# Patient Record
Sex: Male | Born: 1990 | Race: White | Hispanic: No | Marital: Single | Smoking: Current every day smoker
Health system: Southern US, Community
[De-identification: ages and names within clinical notes are randomized; demographics above are authoritative.]

## PROBLEM LIST (undated history)

## (undated) DIAGNOSIS — F32A Depression, unspecified: Secondary | ICD-10-CM

## (undated) DIAGNOSIS — F329 Major depressive disorder, single episode, unspecified: Secondary | ICD-10-CM

## (undated) DIAGNOSIS — J45909 Unspecified asthma, uncomplicated: Secondary | ICD-10-CM

## (undated) DIAGNOSIS — F1110 Opioid abuse, uncomplicated: Secondary | ICD-10-CM

---
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
## 2012-07-06 ENCOUNTER — Encounter: Payer: Self-pay | Admitting: Internal Medicine

## 2012-07-20 ENCOUNTER — Encounter: Payer: Self-pay | Admitting: Internal Medicine

## 2012-07-21 ENCOUNTER — Ambulatory Visit: Payer: Self-pay | Admitting: Internal Medicine

## 2013-01-17 ENCOUNTER — Emergency Department (HOSPITAL_COMMUNITY)
Admission: EM | Admit: 2013-01-17 | Discharge: 2013-01-18 | Disposition: A | Discharge status: Home / Self Care | Payer: BC Managed Care – PPO | Attending: Emergency Medicine | Admitting: Emergency Medicine

## 2013-01-17 ENCOUNTER — Encounter (HOSPITAL_COMMUNITY): Payer: Self-pay | Admitting: *Deleted

## 2013-01-17 DIAGNOSIS — J45909 Unspecified asthma, uncomplicated: Secondary | ICD-10-CM | POA: Insufficient documentation

## 2013-01-17 DIAGNOSIS — F329 Major depressive disorder, single episode, unspecified: Secondary | ICD-10-CM | POA: Insufficient documentation

## 2013-01-17 DIAGNOSIS — T50902A Poisoning by unspecified drugs, medicaments and biological substances, intentional self-harm, initial encounter: Secondary | ICD-10-CM

## 2013-01-17 DIAGNOSIS — T43502A Poisoning by unspecified antipsychotics and neuroleptics, intentional self-harm, initial encounter: Secondary | ICD-10-CM | POA: Insufficient documentation

## 2013-01-17 DIAGNOSIS — F3289 Other specified depressive episodes: Secondary | ICD-10-CM | POA: Insufficient documentation

## 2013-01-17 DIAGNOSIS — T438X2A Poisoning by other psychotropic drugs, intentional self-harm, initial encounter: Secondary | ICD-10-CM | POA: Insufficient documentation

## 2013-01-17 DIAGNOSIS — T424X4A Poisoning by benzodiazepines, undetermined, initial encounter: Secondary | ICD-10-CM | POA: Insufficient documentation

## 2013-01-17 DIAGNOSIS — F17200 Nicotine dependence, unspecified, uncomplicated: Secondary | ICD-10-CM | POA: Insufficient documentation

## 2013-01-17 HISTORY — DX: Depression, unspecified: F32.A

## 2013-01-17 HISTORY — DX: Major depressive disorder, single episode, unspecified: F32.9

## 2013-01-17 HISTORY — DX: Unspecified asthma, uncomplicated: J45.909

## 2013-01-17 LAB — SALICYLATE LEVEL: Salicylate Lvl: <2 mg/dL — ABNORMAL LOW (ref 2.8–20.0)

## 2013-01-17 LAB — CBC
HCT: 39.9 % (ref 39.0–52.0)
MCV: 90.9 fL (ref 78.0–100.0)
RBC: 4.39 MIL/uL (ref 4.22–5.81)

## 2013-01-17 LAB — COMPREHENSIVE METABOLIC PANEL
ALT: 10 U/L (ref 0–53)
AST: 15 U/L (ref 0–37)
Albumin: 4.2 g/dL (ref 3.5–5.2)
Alkaline Phosphatase: 61 U/L (ref 39–117)
BUN: 21 mg/dL (ref 6–23)
Calcium: 9.8 mg/dL (ref 8.4–10.5)
GFR calc Af Amer: >90 mL/min (ref >90)
GFR calc non Af Amer: >90 mL/min (ref >90)
Glucose, Bld: 78 mg/dL (ref 70–99)
Total Bilirubin: 0.5 mg/dL (ref 0.3–1.2)

## 2013-01-17 LAB — ETHANOL: Alcohol, Ethyl (B): <11 mg/dL (ref 0–11)

## 2013-01-17 MED ORDER — LORAZEPAM 1 MG PO TABS: 1.0000 mg | ORAL_TABLET | Freq: Four times a day (QID) | ORAL | Status: DC | PRN

## 2013-01-17 MED ORDER — ZIPRASIDONE MESYLATE 20 MG IM SOLR
10.0000 mg | Freq: Four times a day (QID) | INTRAMUSCULAR | Status: DC | PRN
  Administered 2013-01-17 – 2013-01-18 (×2): 10 mg via INTRAMUSCULAR
  Filled 2013-01-17 (×2): qty 20

## 2013-01-17 MED FILL — Medication: Qty: 1 | Status: AC

## 2013-01-17 NOTE — ED Notes (Signed)
Mother, Buffy: [phone]

## 2013-01-17 NOTE — ED Notes (Addendum)
Pt changed from paper scrubs into street clothes her "got from my girlfriend." Girlfriend made aware that she cannot come back to room by nurse first, but girlfriend found back in pts room refusing to leave. [employer] at bedside made girlfriend aware that she has to leave. Girlfriend escorted out. Pt remains visibly upset and stating "I shouldn't be here. No one can help me. The people who put me in here and the ones who are hurting me the most."

## 2013-01-17 NOTE — ED Notes (Signed)
Pt becoming very upset with mother and girlfriend at bedside. Pt asked both to leave, and requesting no more visitors for the evening. Nurse first made aware. Pt placed in blue scrubs. Personal belongings inventoried.

## 2013-01-17 NOTE — ED Notes (Signed)
AVA WITH THE ACT TEAM IS AWARE OF PT.

## 2013-01-17 NOTE — BH Assessment (Signed)
[facility] Assessment Progress Note      Attempted to assess patient, but he was sleeping.  Nurse tech informed Clinical research associate that his nurse had just given him a lot of medication.  Will continue to follow.

## 2013-01-17 NOTE — BHH Counselor (Signed)
Writer was contacted regarding the consult.  Writer reminded the nurse that ACT Team was at [facility] this morning and is now at [employer] patients.  Writer informed the nurse that I will be coming back to [facility] as soon as I finish the assessments at [facility].

## 2013-01-17 NOTE — ED Notes (Addendum)
Pt arrived by pov. Reports taking 20-30 klonopin  approx 30-45 mins ago. Pt drowsy, tearful on arrival.

## 2013-01-17 NOTE — ED Provider Notes (Signed)
History    CSN: [id number] Arrival date & time 01/17/13  1117  None    Chief Complaint  Patient presents with  . Ingestion  . Medical Clearance   (Consider location/radiation/quality/duration/timing/severity/associated sxs/prior Treatment) HPI  patient overdosed on Klonopin 1 mg tablets 9:30 AM today in effort to kill himself. He is depressed about "how life history me." he will not further elaborate. No treatment prior to coming. No other complaint. Patient has been noncompliant with oral Past Medical History  Diagnosis Date  . Asthma   . Depression    ADHD History reviewed. No pertinent past surgical history. History reviewed. No pertinent family history. History  Substance Use Topics  . Smoking status: Current Every Day Smoker    Types: Cigarettes  . Smokeless tobacco: Not on file  . Alcohol Use: Yes     Comment: occ    positive marijuana use Review of Systems  Psychiatric/Behavioral: Positive for dysphoric mood.  All other systems reviewed and are negative.    Allergies  Review of patient's allergies indicates no known allergies.  Home Medications  No current outpatient prescriptions on file. BP 120/80  Pulse 108  Resp 20  SpO2 98% Physical Exam  Nursing note and vitals reviewed. Constitutional: He appears well-developed and well-nourished.  HENT:  Head: Normocephalic and atraumatic.  Eyes: Conjunctivae are normal. Pupils are equal, round, and reactive to light.  Neck: Neck supple. No tracheal deviation present. No thyromegaly present.  Cardiovascular: Normal rate and regular rhythm.   No murmur heard. Pulmonary/Chest: Effort normal and breath sounds normal.  Abdominal: Soft. Bowel sounds are normal. He exhibits no distension. There is no tenderness.  Musculoskeletal: Normal range of motion. He exhibits no edema and no tenderness.  Neurological: He is alert. Coordination normal.  Skin: Skin is warm and dry. No rash noted.  Psychiatric: He has a normal  mood and affect.    ED Course  Procedures (including critical care time) Labs Reviewed  CBC  COMPREHENSIVE METABOLIC PANEL  ETHANOL  ACETAMINOPHEN LEVEL  SALICYLATE LEVEL  URINE RAPID DRUG SCREEN (HOSP PERFORMED)   No results found. No diagnosis found.   Date: 01/17/2013  Rate: 85  Rhythm: normal sinus rhythm  QRS Axis: normal  Intervals: normal  ST/T Wave abnormalities: normal  Conduction Disutrbances: none  Narrative Interpretation: unremarkable  Results for orders placed during the hospital encounter of 01/17/13  CBC      Result Value Range   WBC 9.3  4.0 - 10.5 K/uL   RBC 4.39  4.22 - 5.81 MIL/uL   Hemoglobin 14.0  13.0 - 17.0 g/dL   HCT 40.9  81.1 - 91.4 %   MCV 90.9  78.0 - 100.0 fL   MCH 31.9  26.0 - 34.0 pg   MCHC 35.1  30.0 - 36.0 g/dL   RDW 78.2  95.6 - 21.3 %   Platelets 155  150 - 400 K/uL  COMPREHENSIVE METABOLIC PANEL      Result Value Range   Sodium 143  135 - 145 mEq/L   Potassium 3.6  3.5 - 5.1 mEq/L   Chloride 105  96 - 112 mEq/L   CO2 29  19 - 32 mEq/L   Glucose, Bld 78  70 - 99 mg/dL   BUN 21  6 - 23 mg/dL   Creatinine, Ser 0.86  0.50 - 1.35 mg/dL   Calcium 9.8  8.4 - 57.8 mg/dL   Total Protein 6.6  6.0 - 8.3 g/dL   Albumin 4.2  3.5 - 5.2 g/dL   AST 15  0 - 37 U/L   ALT 10  0 - 53 U/L   Alkaline Phosphatase 61  39 - 117 U/L   Total Bilirubin 0.5  0.3 - 1.2 mg/dL   GFR calc non Af Amer >90  >90 mL/min   GFR calc Af Amer >90  >90 mL/min  ETHANOL      Result Value Range   Alcohol, Ethyl (B) <11  0 - 11 mg/dL  ACETAMINOPHEN LEVEL      Result Value Range   Acetaminophen (Tylenol), Serum <15.0  10 - 30 ug/mL  SALICYLATE LEVEL      Result Value Range   Salicylate Lvl <2.0 (*) 2.8 - 20.0 mg/dL   No results found.   Spoke with poison center plan observation 6 hours post ingestion, followed by psychiatric evaluation. 3:15 PM patient alert Glasgow Coma Score 15. No distress MDM  Involuntary commitment papers for psychiatric evaluation  followed by me as patient threatening to leave the ED Telepsychiatry  [facility]) consulted to aid in disposition and medications. ACT team consulted to arrange for patient psychiatric stay. Diagnosis #1 drug overdose #2 suicide attempt  Doug Sou, MD 01/17/13 [phone]

## 2013-01-17 NOTE — ED Notes (Signed)
[employer] at bedside. Pt has trained sitter at bedside. Remains calm and cooperative.

## 2013-01-17 NOTE — ED Provider Notes (Signed)
Patient was in noncompliant with our telemetry psychiatry evaluation.  He turned the computer off. He will require additional evaluation one is more cooperative.  We will attempt to find an inpatient psychiatry for evaluation in person.  Gerhard Munch, MD 01/17/13 1757

## 2013-01-18 NOTE — ED Provider Notes (Signed)
Pt received geodon last night to help with agitation/sleep. No current complaints.  Resting comfortably. AF VSS.  Ashby Dawes, MD 01/18/13 [phone]

## 2013-01-18 NOTE — BH Assessment (Signed)
Assessment Note  Update:  Pt received telepsych recommending pt be discharged and follow up with outpatient treatment.  Pt has a current provider, Dr. Evelene Croon, that he will follow up with.  Pt signed a No Harm contract.  Updated assessment disposition.  Updated EDP Dan Humphreys and ED staff.  Pt to be discharged.    Disposition:  Disposition Initial Assessment Completed for this Encounter: Yes Disposition of Patient: Referred to;Outpatient treatment Other disposition(s): To current provider Patient referred to: Outpatient clinic referral (Dr. Evelene Croon)  On Site Evaluation by:   Reviewed with Physician:  Fredirick Lathe, Rennis Harding 01/18/2013 1:03 PM

## 2013-01-18 NOTE — ED Provider Notes (Signed)
Behavioral health has evaluated patient and feels that he is safe for discharge home. He has followup with his psychiatrist today. I have personally assessed the patient and spoken with him regarding suicidal and homicidal ideation.  He currently denies any active thoughts of suicide or homicide. Feel patient is safe for discharge home.  Ashby Dawes, MD 01/18/13 1430

## 2013-01-18 NOTE — ED Notes (Signed)
Pt has asked that mother NOT be allowed to visit- mother was removed from POD C. Only visitor that is allowed in room is girlfriend. Mother stated on way out that "you are not keeping him safe-- that girlfriend is making him take those pills!"

## 2013-01-18 NOTE — BH Assessment (Signed)
Assessment Note   Chad James is an 22 y.o. male that presented to [facility] via his mother after taking an overdose of Klonopin yesterday.  Pt took 10 1 mg Klonopin by report because he had an argument with his girlfriend.  Pt stated they broke up, he told his mother, and she brought him here.  Pt had a telepsych scheduled for last night and turned off the computer.  Pt was also given Geodon.  Pt stated he couldn't sleep.  Pt denies current SI, stating he took the Klonopin because "I was angry and made a stupid mistake."  Pt denies HI or psychosis.  Pt admits to daily marijuana use.  Pt stated he lives alone, but is unsure how he supports himself financially, as he is not in school or working.  Pt sees a psychiatrist, Dr. Lucianne Muss.  Pt is not sure of his diagnosis, but stated he has anxiety.  Pt stated he is prescribed the Klonopin and Adderal.  Pt stated he doesn't take the medications as prescribed.  Pt stated he wanted to go home.  Consulted with EDP Walker who agreed that pt have another telepsych consult for further assessment and evaluation.  Pending telepsych.  Pt calm, cooperative, but irritable during assessment.  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 Asthma   . Depression    Axis IV: economic problems, occupational problems, other psychosocial or environmental problems, problems related to social environment and problems with primary support group Axis V: 31-40 impairment in reality testing  Past Medical History:  Past Medical History  Diagnosis Date  . Asthma   . Depression     History reviewed. No pertinent past surgical history.  Family History: History reviewed. No pertinent family history.  Social History:  reports that he has been smoking Cigarettes.  He has been smoking about 0.00 packs per day. He does not have any smokeless tobacco history on file. He reports that  drinks alcohol. He reports that he uses illicit  drugs (Marijuana).  Additional Social History:  Alcohol / Drug Use Pain Medications: see MAR Prescriptions: see MAR Over the Counter: see MAR History of alcohol / drug use?: Yes Longest period of sobriety (when/how long): unknown Negative Consequences of Use: Personal relationships Withdrawal Symptoms:  (pt denies) Substance #1 Name of Substance 1: Marijuana 1 - Age of First Use: teens 1 - Amount (size/oz): 2 bowls 1 - Frequency: daily 1 - Duration: ongoing 1 - Last Use / Amount: 01/16/13 - 2 bowls  CIWA: CIWA-Ar BP: 98/59 mmHg Pulse Rate: 61 COWS:    Allergies: No Known Allergies  Home Medications:  (Not in a hospital admission)  OB/GYN Status:  No LMP for male patient.  General Assessment Data Location of Assessment: [facility] ED Living Arrangements: Alone Can pt return to current living arrangement?: Yes Admission Status: Voluntary Is patient capable of signing voluntary admission?: Yes Transfer from: Acute Hospital Referral Source: Self/Family/Friend  Education Status Is patient currently in school?: No  Risk to self Suicidal Ideation: No-Not Currently/Within Last 6 Months Suicidal Intent: No-Not Currently/Within Last 6 Months Is patient at risk for suicide?: Yes Suicidal Plan?: Yes-Currently Present Specify Current Suicidal Plan: pt took overdose of Klonopin Access to Means: Yes Specify Access to Suicidal Means: pt is prescribed medication What has been your use of drugs/alcohol within the last 12 months?: pt admits to daily use of marijuana Previous Attempts/Gestures: No How many times?: 0  Other Self Harm Risks: pt denies Triggers for Past Attempts: None known Intentional Self Injurious Behavior: Damaging Comment - Self Injurious Behavior: ongoing SA Family Suicide History: No Recent stressful life event(s): Conflict (Comment) (breakup with girlfriend) Persecutory voices/beliefs?: No Depression: Yes Depression Symptoms: Despondent;Insomnia;Feeling  angry/irritable Substance abuse history and/or treatment for substance abuse?: No Suicide prevention information given to non-admitted patients: Not applicable  Risk to Others Homicidal Ideation: No Thoughts of Harm to Others: No Current Homicidal Intent: No Current Homicidal Plan: No Access to Homicidal Means: No Identified Victim: pt denies History of harm to others?: No Assessment of Violence: None Noted Violent Behavior Description: pt denies Does patient have access to weapons?: No Criminal Charges Pending?: No Does patient have a court date: No  Psychosis Hallucinations: None noted Delusions: None noted  Mental Status Report Appear/Hygiene: Disheveled Eye Contact: Good Motor Activity: Freedom of movement;Unremarkable Speech: Logical/coherent Level of Consciousness: Alert Mood: Irritable Affect: Irritable Anxiety Level: Moderate Thought Processes: Coherent;Relevant Judgement: Unimpaired Orientation: Person;Place;Time;Situation Obsessive Compulsive Thoughts/Behaviors: None  Cognitive Functioning Concentration: Decreased Memory: Recent Intact;Remote Intact IQ: Average Insight: Fair Impulse Control: Poor Appetite: Fair Weight Loss: 0 Weight Gain: 0 Sleep: Decreased Total Hours of Sleep: 3 Vegetative Symptoms: None  ADLScreening [facility] Assessment Services) Patient's cognitive ability adequate to safely complete daily activities?: Yes Patient able to express need for assistance with ADLs?: Yes Independently performs ADLs?: Yes (appropriate for developmental age)  Abuse/Neglect [facility]) Physical Abuse: Denies Verbal Abuse: Denies Sexual Abuse: Denies  Prior Inpatient Therapy Prior Inpatient Therapy: No Prior Therapy Dates: na Prior Therapy Facilty/Provider(s): na Reason for Treatment: na  Prior Outpatient Therapy Prior Outpatient Therapy: Yes Prior Therapy Dates: Current Prior Therapy Facilty/Provider(s): Dr. Lucianne Muss Reason for Treatment: Anxiety  ADL  Screening (condition at time of admission) Patient's cognitive ability adequate to safely complete daily activities?: Yes Is the patient deaf or have difficulty hearing?: No Does the patient have difficulty seeing, even when wearing glasses/contacts?: No Does the patient have difficulty concentrating, remembering, or making decisions?: No Patient able to express need for assistance with ADLs?: Yes Does the patient have difficulty dressing or bathing?: No Independently performs ADLs?: Yes (appropriate for developmental age) Does the patient have difficulty walking or climbing stairs?: No  Home Assistive Devices/Equipment Home Assistive Devices/Equipment: None    Abuse/Neglect Assessment (Assessment to be complete while patient is alone) Physical Abuse: Denies Verbal Abuse: Denies Sexual Abuse: Denies Exploitation of patient/patient's resources: Denies Self-Neglect: Denies Values / Beliefs Cultural Requests During Hospitalization: None Spiritual Requests During Hospitalization: None Consults Spiritual Care Consult Needed: No Social Work Consult Needed: No Merchant navy officer (For Healthcare) Advance Directive: Patient does not have advance directive;Patient would not like information    Additional Information 1:1 In Past 12 Months?: No CIRT Risk: No Elopement Risk: No Does patient have medical clearance?: Yes     Disposition:  Disposition Initial Assessment Completed for this Encounter: Yes Disposition of Patient: Other dispositions Other disposition(s): Other (Comment) (Pending telepsych)  On Site Evaluation by:   Reviewed with Physician:  Fredirick Lathe, Rennis Harding 01/18/2013 10:06 AM

## 2013-01-18 NOTE — ED Notes (Signed)
Spoke with tele psych--

## 2013-01-18 NOTE — ED Notes (Signed)
Pt states that he would like to go home, denies trying to hurt self=states was fighting with girlfriend. States has a hx of depression/anxiety--with an eating disorder. Denies hearing voices--denies visual hallucinations

## 2013-01-18 NOTE — ED Notes (Signed)
Pt signed contract for safety.  

## 2013-04-13 DIAGNOSIS — F329 Major depressive disorder, single episode, unspecified: Secondary | ICD-10-CM | POA: Insufficient documentation

## 2014-05-26 ENCOUNTER — Encounter (HOSPITAL_COMMUNITY): Payer: Self-pay | Admitting: *Deleted

## 2014-05-26 ENCOUNTER — Emergency Department (HOSPITAL_COMMUNITY)
Admission: EM | Admit: 2014-05-26 | Discharge: 2014-05-26 | Disposition: A | Discharge status: Home / Self Care | Payer: BC Managed Care – PPO | Attending: Emergency Medicine | Admitting: Emergency Medicine

## 2014-05-26 DIAGNOSIS — Z79899 Other long term (current) drug therapy: Secondary | ICD-10-CM | POA: Insufficient documentation

## 2014-05-26 DIAGNOSIS — Y9389 Activity, other specified: Secondary | ICD-10-CM | POA: Insufficient documentation

## 2014-05-26 DIAGNOSIS — Z720 Tobacco use: Secondary | ICD-10-CM | POA: Insufficient documentation

## 2014-05-26 DIAGNOSIS — F329 Major depressive disorder, single episode, unspecified: Secondary | ICD-10-CM | POA: Insufficient documentation

## 2014-05-26 DIAGNOSIS — S40851A Superficial foreign body of right upper arm, initial encounter: Secondary | ICD-10-CM | POA: Insufficient documentation

## 2014-05-26 DIAGNOSIS — J45909 Unspecified asthma, uncomplicated: Secondary | ICD-10-CM | POA: Insufficient documentation

## 2014-05-26 DIAGNOSIS — W260XXA Contact with knife, initial encounter: Secondary | ICD-10-CM | POA: Insufficient documentation

## 2014-05-26 DIAGNOSIS — Y998 Other external cause status: Secondary | ICD-10-CM | POA: Insufficient documentation

## 2014-05-26 DIAGNOSIS — Y9289 Other specified places as the place of occurrence of the external cause: Secondary | ICD-10-CM | POA: Insufficient documentation

## 2014-05-26 HISTORY — DX: Opioid abuse, uncomplicated: F11.10

## 2014-05-26 MED ORDER — LIDOCAINE-EPINEPHRINE (PF) 2 %-1:200000 IJ SOLN
10.0000 mL | Freq: Once | INTRAMUSCULAR | Status: AC
  Administered 2014-05-26: 10 mL

## 2014-05-26 MED ORDER — SULFAMETHOXAZOLE-TRIMETHOPRIM 800-160 MG PO TABS: 1.0000 | ORAL_TABLET | Freq: Two times a day (BID) | ORAL | Status: DC

## 2014-05-26 MED ORDER — LIDOCAINE-EPINEPHRINE (PF) 2 %-1:200000 IJ SOLN
INTRAMUSCULAR | Status: AC
  Filled 2014-05-26: qty 20

## 2014-05-26 MED ORDER — NAPROXEN 500 MG PO TABS: 500.0000 mg | ORAL_TABLET | Freq: Two times a day (BID) | ORAL | Status: DC

## 2014-05-26 NOTE — ED Provider Notes (Signed)
CSN: [id number]     Arrival date & time 05/26/14  [id number] History  This chart was scribed for Vida RollerBrian D Jamail Cullers, MD by Roxy Cedarhandni Bhalodia, ED Scribe. This patient was seen in room A02C/A02C and the patient's care was started at 3:30 AM.   Chief Complaint  Patient presents with  . Abscess   Patient is a 23 y.o. male presenting with abscess. The history is provided by the patient. No language interpreter was used.  Abscess Associated symptoms: fever    HPI Comments: Chad James is a 23 y.o. male who presents to the Emergency Department complaining of abscess to right antecubital area due to a metal needle that broke off while patient was shooting heroin 1.5 weeks ago. He reports associated fevers and chills due to withdrawal.  Past Medical History  Diagnosis Date  . Asthma   . Depression   . Heroin abuse    History reviewed. No pertinent past surgical history. No family history on file. History  Substance Use Topics  . Smoking status: Current Every Day Smoker    Types: Cigarettes  . Smokeless tobacco: Not on file  . Alcohol Use: Yes     Comment: occ   Review of Systems  Constitutional: Positive for fever and chills.  Skin: Positive for wound.  All other systems reviewed and are negative.  Allergies  Review of patient's allergies indicates no known allergies.  Home Medications   Prior to Admission medications   Medication Sig Start Date End Date Taking? Authorizing Provider  albuterol (PROVENTIL HFA;VENTOLIN HFA) 108 (90 BASE) MCG/ACT inhaler Inhale 2 puffs into the lungs every 6 (six) hours as needed for wheezing.   Yes Historical Provider, MD  clonazePAM (KLONOPIN) 1 MG tablet Take 1 mg by mouth daily as needed for anxiety.   Yes Historical Provider, MD  diphenhydrAMINE (BENADRYL) 25 MG tablet Take 25 mg by mouth every 6 (six) hours as needed for allergies.   Yes Historical Provider, MD  Melatonin 5 MG TABS Take 5 mg by mouth at bedtime as needed (sleep).   Yes Historical  Provider, MD  megestrol (MEGACE) 40 MG/ML suspension Take 400 mg by mouth daily.    Historical Provider, MD  naproxen (NAPROSYN) 500 MG tablet Take 1 tablet (500 mg total) by mouth 2 (two) times daily with a meal. 05/26/14   Vida RollerBrian D Kenyetta Fife, MD  sulfamethoxazole-trimethoprim (SEPTRA DS) 800-160 MG per tablet Take 1 tablet by mouth every 12 (twelve) hours. 05/26/14   Vida RollerBrian D Abdimalik Mayorquin, MD   Triage Vitals: BP 116/65 mmHg  Pulse 89  Temp(Src) 98 F (36.7 C) (Oral)  Resp 18  SpO2 97%  Physical Exam  Constitutional: He appears well-developed and well-nourished.  HENT:  Head: Normocephalic and atraumatic.  Eyes: Conjunctivae are normal. Right eye exhibits no discharge. Left eye exhibits no discharge.  Pulmonary/Chest: Effort normal. No respiratory distress.  Neurological: He is alert. Coordination normal.  Skin: Skin is warm and dry. No rash noted. He is not diaphoretic. There is erythema.  R. AC scarring along vein line. Mild tenderness and redness. US images archived confirming metallic foreign body.  Psychiatric: He has a normal mood and affect.  Nursing note and vitals reviewed.  ED Course  Procedures (including critical care time)  DIAGNOSTIC STUDIES: Oxygen Saturation is 97% on RA, normal by my interpretation.    COORDINATION OF CARE: 3:34 AM- Discussed plans to apply lidocaine injection to affected area and will attempt to surgically excise metal needle from patient's arm. Pt  advised of plan for treatment and pt agrees.  Labs Review Labs Reviewed - No data to display  Imaging Review No results found.   MDM   Final diagnoses:  Foreign body of upper arm, right, initial encounter    Bedside ultrasound reveals a foreign body in the tissues in the left antecubital fossa. Please see procedure note below  Procedure Note:  Ultrasound soft tissue performed by Vida RollerBrian D Murlin Schrieber, MD  Upper extremity Other (refer to comments) No abcess noted and Other metallic FB seen - linear No  abcess noted  Images archived.  Procedure note:  Foreign body removal from soft tissue  Risks benefits and alternatives of the procedure were discussed with the patient, verbal and written consent obtained area Patient placed in the supine position, right antecubital fossa was exposed, sterilized with Betadine, allowed to dry completely. This area remained sterile throughout the procedure. Sterile drapes applied, lidocaine 1% with epinephrine injected into the surrounding skin Incision made with #11 blade oversight of foreign body, exploration with forceps until foreign body was discovered and removed with forceps.  Sutures placed loosely to allow drainage from the wound, hemostasis obtained, sterile dressing placed  Patient instructed on how to care for the wound, tolerated procedure without difficulty.   Meds given in ED:  Medications  lidocaine-EPINEPHrine (XYLOCAINE W/EPI) 2 %-1:200000 (PF) injection (  Duplicate 05/26/14 0450)  lidocaine-EPINEPHrine (XYLOCAINE W/EPI) 2 %-1:200000 (PF) injection 10 mL (10 mLs Infiltration Given 05/26/14 0449)    New Prescriptions   NAPROXEN (NAPROSYN) 500 MG TABLET    Take 1 tablet (500 mg total) by mouth 2 (two) times daily with a meal.   SULFAMETHOXAZOLE-TRIMETHOPRIM (SEPTRA DS) 800-160 MG PER TABLET    Take 1 tablet by mouth every 12 (twelve) hours.        I personally performed the services described in this documentation, which was scribed in my presence. The recorded information has been reviewed and is accurate.   Vida RollerBrian D Angeldejesus Callaham, MD 05/26/14 [phone]

## 2014-05-26 NOTE — ED Notes (Signed)
Pt is concerned about that he has an [facility]  He was trying to shoot up heroin.

## 2014-05-26 NOTE — Discharge Instructions (Signed)
Please be aware that because you had a needle in your arm you are predisposed to infections such as staph infections. I am treating it with an antibiotic to hopefully prevent that from happening. Please change or dressing twice a day, take the antibiotics for 10 days and follow-up in 2 days for a wound check with your family doctor or he may return to the hospital for a wound check to make sure that it is not becoming infected. The stitches should come out in 7 days by a healthcare professional.  Please call your doctor for a followup appointment within 24-48 hours. When you talk to your doctor please let them know that you were seen in the emergency department and have them acquire all of your records so that they can discuss the findings with you and formulate a treatment plan to fully care for your new and ongoing problems.

## 2014-05-31 ENCOUNTER — Emergency Department (HOSPITAL_COMMUNITY): Payer: Self-pay

## 2014-05-31 ENCOUNTER — Encounter (HOSPITAL_COMMUNITY): Payer: Self-pay | Admitting: Emergency Medicine

## 2014-05-31 ENCOUNTER — Emergency Department (HOSPITAL_COMMUNITY)
Admission: EM | Admit: 2014-05-31 | Discharge: 2014-05-31 | Disposition: A | Discharge status: Home / Self Care | Payer: Self-pay | Attending: Emergency Medicine | Admitting: Emergency Medicine

## 2014-05-31 DIAGNOSIS — J45909 Unspecified asthma, uncomplicated: Secondary | ICD-10-CM | POA: Insufficient documentation

## 2014-05-31 DIAGNOSIS — Z8659 Personal history of other mental and behavioral disorders: Secondary | ICD-10-CM | POA: Insufficient documentation

## 2014-05-31 DIAGNOSIS — L03113 Cellulitis of right upper limb: Secondary | ICD-10-CM | POA: Insufficient documentation

## 2014-05-31 DIAGNOSIS — Z720 Tobacco use: Secondary | ICD-10-CM | POA: Insufficient documentation

## 2014-05-31 DIAGNOSIS — Z791 Long term (current) use of non-steroidal anti-inflammatories (NSAID): Secondary | ICD-10-CM | POA: Insufficient documentation

## 2014-05-31 DIAGNOSIS — R0602 Shortness of breath: Secondary | ICD-10-CM | POA: Insufficient documentation

## 2014-05-31 LAB — CBC WITH DIFFERENTIAL/PLATELET
Basophils Absolute: 0 10*3/uL (ref 0.0–0.1)
Basophils Relative: 0 % (ref 0–1)
EOS ABS: 0.1 10*3/uL (ref 0.0–0.7)
EOS PCT: 1 % (ref 0–5)
HCT: 46.1 % (ref 39.0–52.0)
HEMOGLOBIN: 15.7 g/dL (ref 13.0–17.0)
LYMPHS ABS: 1.4 10*3/uL (ref 0.7–4.0)
LYMPHS PCT: 14 % (ref 12–46)
MCH: 31.1 pg (ref 26.0–34.0)
MCHC: 34.1 g/dL (ref 30.0–36.0)
MCV: 91.3 fL (ref 78.0–100.0)
MONOS PCT: 4 % (ref 3–12)
Monocytes Absolute: 0.4 10*3/uL (ref 0.1–1.0)
Neutro Abs: 8.1 10*3/uL — ABNORMAL HIGH (ref 1.7–7.7)
Neutrophils Relative %: 81 % — ABNORMAL HIGH (ref 43–77)
Platelets: 216 10*3/uL (ref 150–400)
RBC: 5.05 MIL/uL (ref 4.22–5.81)
RDW: 12.4 % (ref 11.5–15.5)
WBC: 10.1 10*3/uL (ref 4.0–10.5)

## 2014-05-31 LAB — COMPREHENSIVE METABOLIC PANEL
ALK PHOS: 90 U/L (ref 39–117)
ALT: 13 U/L (ref 0–53)
ANION GAP: 14 (ref 5–15)
AST: 22 U/L (ref 0–37)
Albumin: 4.3 g/dL (ref 3.5–5.2)
BUN: 12 mg/dL (ref 6–23)
CO2: 26 meq/L (ref 19–32)
Calcium: 9.5 mg/dL (ref 8.4–10.5)
Chloride: 100 mEq/L (ref 96–112)
Creatinine, Ser: 1.12 mg/dL (ref 0.50–1.35)
GLUCOSE: 78 mg/dL (ref 70–99)
POTASSIUM: 4.6 meq/L (ref 3.7–5.3)
Sodium: 140 mEq/L (ref 137–147)
TOTAL PROTEIN: 6.9 g/dL (ref 6.0–8.3)
Total Bilirubin: <0.2 mg/dL — ABNORMAL LOW (ref 0.3–1.2)

## 2014-05-31 LAB — I-STAT TROPONIN, ED: Troponin i, poc: 0 ng/mL (ref 0.00–0.08)

## 2014-05-31 MED ORDER — HYDROCODONE-ACETAMINOPHEN 5-325 MG PO TABS
1.0000 | ORAL_TABLET | Freq: Once | ORAL | Status: AC
  Administered 2014-05-31: 1 via ORAL

## 2014-05-31 MED ORDER — SODIUM CHLORIDE 0.9 % IV BOLUS (SEPSIS)
1000.0000 mL | Freq: Once | INTRAVENOUS | Status: AC
  Administered 2014-05-31: 1000 mL via INTRAVENOUS

## 2014-05-31 NOTE — ED Notes (Signed)
Attempted IV x2. Janett BillowJennaya, RN at the bedside attempting.

## 2014-05-31 NOTE — ED Notes (Signed)
The pt denies drug use yhet he is falling asleep

## 2014-05-31 NOTE — ED Notes (Signed)
The  Person with the pt came out of triage to say he was acting weird acting like he was going to pass out.  Vitals rechecked.  He is acting differnet

## 2014-05-31 NOTE — ED Notes (Signed)
The pt  Is having an allergic reaction to soemthing.  Generalized rash swelling in his  Hands and feet for one hour.  He feels like he cannot breathe as well as usual.  No previous history

## 2014-05-31 NOTE — ED Provider Notes (Signed)
CSN: [id number]     Arrival date & time 05/31/14  1959 History   First MD Initiated Contact with Patient 05/31/14 2149     Chief Complaint  Patient presents with  . Allergic Reaction     (Consider location/radiation/quality/duration/timing/severity/associated sxs/prior Treatment) HPI  23 year old male with a known allergies this emergency department today for questionable allergic reaction. Patient states that he had full body rash along with shortness of breath and the feeling that his throat was closing and lips were swelling approximately 3 hours prior to arrival. Patient did not have any interventions and came here for further evaluation. Patient found to be sleepy during exam as reported recent marijuana use but denies this to me. He also states that he had a foreign body in his right AC fossa that was removed approximately a week ago and that has been hurting worse as well. His symptoms are now improving where he does not have as bad of a rash feels like his lips are still slightly swollen but not as bad before and he does not have the feeling of his throat closing up any longer.  Past Medical History  Diagnosis Date  . Asthma   . Depression   . Heroin abuse    History reviewed. No pertinent past surgical history. No family history on file. History  Substance Use Topics  . Smoking status: Current Every Day Smoker    Types: Cigarettes  . Smokeless tobacco: Not on file  . Alcohol Use: Yes     Comment: occ    Review of Systems  Constitutional: Negative for fever, chills and fatigue.  Respiratory: Positive for shortness of breath.   Genitourinary: Negative for hematuria and enuresis.  Skin: Positive for rash.  All other systems reviewed and are negative.     Allergies  Review of patient's allergies indicates no known allergies.  Home Medications   Prior to Admission medications   Medication Sig Start Date End Date Taking? Authorizing Provider  clonazePAM (KLONOPIN) 1  MG tablet Take 1 mg by mouth daily as needed for anxiety.   Yes Historical Provider, MD  naproxen (NAPROSYN) 500 MG tablet Take 1 tablet (500 mg total) by mouth 2 (two) times daily with a meal. 05/26/14  Yes Vida RollerBrian D Miller, MD  Pseudoephedrine-APAP-DM (DAYQUIL PO) Take 1 Dose by mouth as needed (cold symptoms).   Yes Historical Provider, MD  sulfamethoxazole-trimethoprim (SEPTRA DS) 800-160 MG per tablet Take 1 tablet by mouth every 12 (twelve) hours. 05/26/14  Yes Vida RollerBrian D Miller, MD   BP 108/67 mmHg  Pulse 88  Temp(Src) 98.5 F (36.9 C) (Oral)  Resp 18  Ht 5\' 3"  (1.6 m)  Wt 155 lb (70.308 kg)  BMI 27.46 kg/m2  SpO2 95% Physical Exam  Constitutional: He is oriented to person, place, and time. He appears well-developed and well-nourished.  HENT:  Head: Normocephalic and atraumatic.  Eyes: Pupils are equal, round, and reactive to light.  Cardiovascular: Normal rate and regular rhythm.   Pulmonary/Chest: Effort normal and breath sounds normal. He has no wheezes. He has no rales.  Abdominal: Soft. He exhibits no distension. There is no tenderness.  Neurological: He is alert and oriented to person, place, and time.  Skin: Skin is warm and dry. Rash ( diffuse papular rash) noted.  Nursing note and vitals reviewed.   ED Course  Procedures (including critical care time) Labs Review Labs Reviewed  CBC WITH DIFFERENTIAL - Abnormal; Notable for the following:    Neutrophils Relative %  81 (*)    Neutro Abs 8.1 (*)    All other components within normal limits  COMPREHENSIVE METABOLIC PANEL - Abnormal; Notable for the following:    Total Bilirubin <0.2 (*)    All other components within normal limits  URINE RAPID DRUG SCREEN (HOSP PERFORMED)  I-STAT TROPOININ, ED    Imaging Review Dg Chest 2 View  05/31/2014   CLINICAL DATA:  Shortness of breath, asthma  EXAM: CHEST  2 VIEW  COMPARISON:  02/28/2004  FINDINGS: Lungs are essentially clear. No focal consolidation. No pleural effusion or  pneumothorax.  The heart is normal in size.  Visualized osseous structures are within normal limits.  IMPRESSION: No evidence of acute cardiopulmonary disease.   Electronically Signed   By: Charline BillsSriyesh  Krishnan M.D.   On: 05/31/2014 23:43     EKG Interpretation None      MDM   Final diagnoses:  SOB (shortness of breath)  Cellulitis of right upper extremity    23 year old male with likely allergic reaction resolved without intervention. Stable on my evaluation. Labs and imaging negative patient alert and oriented the whole time during evaluation. Still on antibiotics for the cellulitis in his right arm which has improved per the ED physician who took care of him initially. To be stable for discharge to continue antibiotics and to return here in 3-4 days for suture removal.   Marily MemosJason Tomeeka Plaugher, MD 06/01/14 0104  Richardean Canalavid H Yao, MD 06/01/14 [id number]

## 2014-05-31 NOTE — ED Notes (Addendum)
Patient back from XR and refusing to have an EKG completed. Explained to patient how our evaluation process works.  Additionally patient is refusing to give us a urine sample. Explained to patient again our evaluation process.

## 2014-05-31 NOTE — ED Notes (Signed)
Pt attempted to leave before receiving d/c papers and signing, pt advised he needed to wait until papers were brought in. Pt back to room 35 at this time.

## 2014-05-31 NOTE — ED Notes (Signed)
EKG delayed due to patient in XR

## 2014-05-31 NOTE — ED Notes (Signed)
The pt admits to smoking marijuana earlier today

## 2014-05-31 NOTE — Discharge Instructions (Signed)
Continue taking your antibiotics.   Stop using drugs.   Suture removal in several days.   Follow up with your doctor.   Return to ER if you have severe pain, fever, rash, trouble breathing.

## 2014-05-31 NOTE — ED Notes (Signed)
The pt is getting sleepier and sleepier

## 2014-07-09 ENCOUNTER — Emergency Department (HOSPITAL_COMMUNITY)
Admission: EM | Admit: 2014-07-09 | Discharge: 2014-07-09 | Disposition: A | Discharge status: Home / Self Care | Payer: Self-pay | Attending: Emergency Medicine | Admitting: Emergency Medicine

## 2014-07-09 ENCOUNTER — Encounter (HOSPITAL_COMMUNITY): Payer: Self-pay | Admitting: *Deleted

## 2014-07-09 DIAGNOSIS — R1110 Vomiting, unspecified: Secondary | ICD-10-CM | POA: Insufficient documentation

## 2014-07-09 DIAGNOSIS — L02416 Cutaneous abscess of left lower limb: Secondary | ICD-10-CM | POA: Insufficient documentation

## 2014-07-09 DIAGNOSIS — Z8659 Personal history of other mental and behavioral disorders: Secondary | ICD-10-CM | POA: Insufficient documentation

## 2014-07-09 DIAGNOSIS — Z792 Long term (current) use of antibiotics: Secondary | ICD-10-CM | POA: Insufficient documentation

## 2014-07-09 DIAGNOSIS — R000 Tachycardia, unspecified: Secondary | ICD-10-CM | POA: Insufficient documentation

## 2014-07-09 DIAGNOSIS — L0291 Cutaneous abscess, unspecified: Secondary | ICD-10-CM

## 2014-07-09 DIAGNOSIS — J45909 Unspecified asthma, uncomplicated: Secondary | ICD-10-CM | POA: Insufficient documentation

## 2014-07-09 DIAGNOSIS — Z791 Long term (current) use of non-steroidal anti-inflammatories (NSAID): Secondary | ICD-10-CM | POA: Insufficient documentation

## 2014-07-09 DIAGNOSIS — Z720 Tobacco use: Secondary | ICD-10-CM | POA: Insufficient documentation

## 2014-07-09 DIAGNOSIS — R609 Edema, unspecified: Secondary | ICD-10-CM

## 2014-07-09 LAB — CBC WITH DIFFERENTIAL/PLATELET
BASOS ABS: 0 10*3/uL (ref 0.0–0.1)
BASOS PCT: 0 % (ref 0–1)
EOS PCT: 0 % (ref 0–5)
Eosinophils Absolute: 0 10*3/uL (ref 0.0–0.7)
HEMATOCRIT: 44.9 % (ref 39.0–52.0)
HEMOGLOBIN: 15.6 g/dL (ref 13.0–17.0)
LYMPHS PCT: 12 % (ref 12–46)
Lymphs Abs: 1.3 10*3/uL (ref 0.7–4.0)
MCH: 31.2 pg (ref 26.0–34.0)
MCHC: 34.7 g/dL (ref 30.0–36.0)
MCV: 89.8 fL (ref 78.0–100.0)
MONO ABS: 0.9 10*3/uL (ref 0.1–1.0)
MONOS PCT: 8 % (ref 3–12)
Neutro Abs: 9.2 10*3/uL — ABNORMAL HIGH (ref 1.7–7.7)
Neutrophils Relative %: 80 % — ABNORMAL HIGH (ref 43–77)
Platelets: 167 10*3/uL (ref 150–400)
RBC: 5 MIL/uL (ref 4.22–5.81)
RDW: 12.5 % (ref 11.5–15.5)
WBC: 11.5 10*3/uL — ABNORMAL HIGH (ref 4.0–10.5)

## 2014-07-09 LAB — COMPREHENSIVE METABOLIC PANEL
ALBUMIN: 4.6 g/dL (ref 3.5–5.2)
ALT: 17 U/L (ref 0–53)
AST: 20 U/L (ref 0–37)
Alkaline Phosphatase: 85 U/L (ref 39–117)
Anion gap: 7 (ref 5–15)
BUN: 15 mg/dL (ref 6–23)
CALCIUM: 9.8 mg/dL (ref 8.4–10.5)
CHLORIDE: 99 meq/L (ref 96–112)
CO2: 30 mmol/L (ref 19–32)
CREATININE: 1.08 mg/dL (ref 0.50–1.35)
GFR calc Af Amer: >90 mL/min (ref >90)
Glucose, Bld: 114 mg/dL — ABNORMAL HIGH (ref 70–99)
Potassium: 4.4 mmol/L (ref 3.5–5.1)
SODIUM: 136 mmol/L (ref 135–145)
Total Bilirubin: 1.1 mg/dL (ref 0.3–1.2)
Total Protein: 7.3 g/dL (ref 6.0–8.3)

## 2014-07-09 MED ORDER — LIDOCAINE-EPINEPHRINE 2 %-1:100000 IJ SOLN
20.0000 mL | Freq: Once | INTRAMUSCULAR | Status: DC
  Filled 2014-07-09: qty 20

## 2014-07-09 MED ORDER — IBUPROFEN 800 MG PO TABS: 800.0000 mg | ORAL_TABLET | Freq: Three times a day (TID) | ORAL | Status: DC

## 2014-07-09 MED ORDER — HYDROCODONE-ACETAMINOPHEN 5-325 MG PO TABS: 2.0000 | ORAL_TABLET | Freq: Once | ORAL | Status: DC

## 2014-07-09 MED ORDER — SODIUM CHLORIDE 0.9 % IV BOLUS (SEPSIS)
1000.0000 mL | Freq: Once | INTRAVENOUS | Status: AC
  Administered 2014-07-09: 1000 mL via INTRAVENOUS

## 2014-07-09 MED ORDER — LIDOCAINE HCL (PF) 2 % IJ SOLN
10.0000 mL | Freq: Once | INTRAMUSCULAR | Status: AC
  Administered 2014-07-09: 10 mL
  Filled 2014-07-09: qty 10

## 2014-07-09 MED ORDER — OXYCODONE-ACETAMINOPHEN 5-325 MG PO TABS
1.0000 | ORAL_TABLET | Freq: Once | ORAL | Status: AC
  Administered 2014-07-09: 1 via ORAL
  Filled 2014-07-09: qty 1

## 2014-07-09 MED ORDER — CEPHALEXIN 500 MG PO CAPS: 500.0000 mg | ORAL_CAPSULE | Freq: Four times a day (QID) | ORAL | Status: DC

## 2014-07-09 NOTE — ED Notes (Signed)
Patient states he noticed a red spot on day one.  2nd day he noted more pain and more redness with pain.  Patient now has large swollen are from mid forearm to fingers on the left arm.  The center or spot has shinny red area with scab.  Patient did try to pop it yestereday.  Patient denies any known animal bite.  No recent laceration.  Patient states this is the worse pain.  No pain meds today

## 2014-07-09 NOTE — ED Provider Notes (Signed)
CSN: [id number]     Arrival date & time 07/09/14  1432 History   First MD Initiated Contact with Patient 07/09/14 1559     This chart was scribed for non-physician practitioner, Joycie Peek PA-C working with Toy Cookey, MD by Arlan Organ, ED Scribe. This patient was seen in room TR09C/TR09C and the patient's care was started at 4:22 PM.   Chief Complaint  Patient presents with  . Insect Bite  . Arm Swelling   The history is provided by the patient and a parent. No language interpreter was used.    HPI Comments: Chad James is a 24 y.o. Male with a PMHx of Asthma who presents to the Emergency Department complaining of a possible insect bite to the L forearm x 3 days. Pt reports swelling and redness to the medial aspect of forearm. Area is painful and progressively worsening  with intermittent numbness and paresthesia to the L arm. Pain is exacerbated with movements of arm and palpation. He has tried warm compresses to the arm. No OTC medication taken for relief. States he attempted to "pop" the area with a needle without any success. [name] also mentions 1 episode of vomiting 2 days ago. No recent fever, chills, nausea, abdominal pain, diarrhea, constipations. Pt is an every day smoker. No known allergies to medications.  Past Medical History  Diagnosis Date  . Asthma   . Depression   . Heroin abuse    History reviewed. No pertinent past surgical history. No family history on file. History  Substance Use Topics  . Smoking status: Current Every Day Smoker    Types: Cigarettes  . Smokeless tobacco: Not on file  . Alcohol Use: Yes     Comment: occ    Review of Systems  Constitutional: Negative for fever and chills.  Gastrointestinal: Positive for vomiting. Negative for nausea, abdominal pain, diarrhea and constipation.  Skin: Positive for wound.  Neurological: Positive for numbness.  All other systems reviewed and are negative.     Allergies  Review of patient's  allergies indicates no known allergies.  Home Medications   Prior to Admission medications   Medication Sig Start Date End Date Taking? Authorizing Provider  cephALEXin (KEFLEX) 500 MG capsule Take 1 capsule (500 mg total) by mouth 4 (four) times daily. 07/09/14   Earle Gell Coner Gibbard, PA-C  clonazePAM (KLONOPIN) 1 MG tablet Take 1 mg by mouth daily as needed for anxiety.    Historical Provider, MD  ibuprofen (ADVIL,MOTRIN) 800 MG tablet Take 1 tablet (800 mg total) by mouth 3 (three) times daily. 07/09/14   Earle Gell Islah Eve, PA-C  naproxen (NAPROSYN) 500 MG tablet Take 1 tablet (500 mg total) by mouth 2 (two) times daily with a meal. 05/26/14   Vida Roller, MD  Pseudoephedrine-APAP-DM (DAYQUIL PO) Take 1 Dose by mouth as needed (cold symptoms).    Historical Provider, MD  sulfamethoxazole-trimethoprim (SEPTRA DS) 800-160 MG per tablet Take 1 tablet by mouth every 12 (twelve) hours. 05/26/14   Vida Roller, MD    Triage Vitals: BP 123/80 mmHg  Pulse 117  Temp(Src) 98.8 F (37.1 C) (Oral)  Resp 20  Ht  (1.905 m)  Wt 130 lb 5 oz (59.109 kg)  BMI 16.29 kg/m2  SpO2 98%   Physical Exam  Constitutional: He appears well-developed and well-nourished. No distress.  HENT:  Head: Normocephalic and atraumatic.  Neck: Neck supple.  Cardiovascular: Regular rhythm, S1 normal and normal heart sounds.  Tachycardia present.  Pulmonary/Chest: Effort normal and breath sounds normal.  Abdominal: Soft. Bowel sounds are normal.  Musculoskeletal:  Maintains full active ROM of left upper extremity and wrist  Neurological: He is alert.  Grip strength intact. Motor and sensation 5/5.  Skin: He is not diaphoretic.  Area of abscess over medial aspect of L wrist approximately 3 cm in diameter No active drainage There is significant erythema and edma around site with streaking to the mid shaft of the L forearm  Nursing note and vitals reviewed.     ED Course  Procedures (including critical  care time)  DIAGNOSTIC STUDIES: Oxygen Saturation is 98% on RA, Normal by my interpretation.    COORDINATION OF CARE: 4:33 PM- Will perform I&D procedure. Discussed treatment plan with pt at bedside and pt agreed to plan.     INCISION AND DRAINAGE PROCEDURE NOTE: Patient identification was confirmed and verbal consent was obtained. This procedure was performed by Joycie Peek PA-C at 5:49 PM. Site: L forearm Sterile procedures observed Anesthetic used (type and amt): 5 CC of 2% Lidocaine without epinephrine  Drainage: Moderate Complexity: Complex Packing used: None Site anesthetized, incision made over site, wound drained and explored loculations, rinsed with copious amounts of normal saline, wound packed with sterile gauze, covered with dry, sterile dressing.  Pt tolerated procedure well without complications.  Instructions for care discussed verbally and pt provided with additional written instructions for homecare and f/u.  Labs Review Labs Reviewed  CBC WITH DIFFERENTIAL - Abnormal; Notable for the following:    WBC 11.5 (*)    Neutrophils Relative % 80 (*)    Neutro Abs 9.2 (*)    All other components within normal limits  COMPREHENSIVE METABOLIC PANEL - Abnormal; Notable for the following:    Glucose, Bld 114 (*)    All other components within normal limits  WOUND CULTURE    Imaging Review No results found.   EKG Interpretation None     Meds given in ED:  Medications  oxyCODONE-acetaminophen (PERCOCET/ROXICET) 5-325 MG per tablet 1 tablet (1 tablet Oral Given 07/09/14 1507)  sodium chloride 0.9 % bolus 1,000 mL (0 mLs Intravenous Stopped 07/09/14 1735)  lidocaine (XYLOCAINE) 2 % injection 10 mL (10 mLs Other Given 07/09/14 1723)    New Prescriptions   CEPHALEXIN (KEFLEX) 500 MG CAPSULE    Take 1 capsule (500 mg total) by mouth 4 (four) times daily.   IBUPROFEN (ADVIL,MOTRIN) 800 MG TABLET    Take 1 tablet (800 mg total) by mouth 3 (three) times daily.    Filed Vitals:   07/09/14 1446 07/09/14 1448 07/09/14 1654  BP: 123/80  101/65  Pulse: 117  92  Temp: 98.8 F (37.1 C)  98.8 F (37.1 C)  TempSrc: Oral  Oral  Resp: 20  18  Height:   (1.905 m)   Weight:  130 lb 5 oz (59.109 kg)   SpO2: 98%  98%    MDM  Vitals stable - WNL -afebrile, tachycardia resolved Pt resting comfortably in ED. PE--Abscess on L distal forearm, drained at bedside by myself with copious purulent discharge. Pain much improved after I&D Labwork--Leukocytosis 11.5  DDX--Likely self limited forearm abscess. No evidence of systemic infection. No other rash Will DC with Abx, sterile dressing, ibuprofen, wound care instructions. I discussed all relevant lab findings and imaging results with pt and father, they verbalized understanding. Discussed f/u with PCP within 48 hrs for wound recheck and ED return precautions, pt and father very amenable to plan.  Prior to patient discharge, I discussed and reviewed this case with Dr. Micheline Mazeocherty    Final diagnoses:  Abscess      I personally performed the services described in this documentation, which was scribed in my presence. The recorded information has been reviewed and is accurate.    Earle GellBenjamin W Wheatfieldartner, PA-C 07/10/14 1136  Toy CookeyMegan Docherty, MD 07/10/14 1215

## 2014-07-09 NOTE — Discharge Instructions (Signed)
Abscess An abscess is an infected area that contains a collection of pus and debris.It can occur in almost any part of the body. An abscess is also known as a furuncle or boil. CAUSES  An abscess occurs when tissue gets infected. This can occur from blockage of oil or sweat glands, infection of hair follicles, or a minor injury to the skin. As the body tries to fight the infection, pus collects in the area and creates pressure under the skin. This pressure causes pain. People with weakened immune systems have difficulty fighting infections and get certain abscesses more often.  SYMPTOMS Usually an abscess develops on the skin and becomes a painful mass that is red, warm, and tender. If the abscess forms under the skin, you may feel a moveable soft area under the skin. Some abscesses break open (rupture) on their own, but most will continue to get worse without care. The infection can spread deeper into the body and eventually into the bloodstream, causing you to feel ill.  DIAGNOSIS  Your caregiver will take your medical history and perform a physical exam. A sample of fluid may also be taken from the abscess to determine what is causing your infection. TREATMENT  Your caregiver may prescribe antibiotic medicines to fight the infection. However, taking antibiotics alone usually does not cure an abscess. Your caregiver may need to make a small cut (incision) in the abscess to drain the pus. In some cases, gauze is packed into the abscess to reduce pain and to continue draining the area. HOME CARE INSTRUCTIONS   Only take over-the-counter or prescription medicines for pain, discomfort, or fever as directed by your caregiver.  If you were prescribed antibiotics, take them as directed. Finish them even if you start to feel better.  If gauze is used, follow your caregiver's directions for changing the gauze.  To avoid spreading the infection:  Keep your draining abscess covered with a  bandage.  Wash your hands well.  Do not share personal care items, towels, or whirlpools with others.  Avoid skin contact with others.  Keep your skin and clothes clean around the abscess.  Keep all follow-up appointments as directed by your caregiver. SEEK MEDICAL CARE IF:   You have increased pain, swelling, redness, fluid drainage, or bleeding.  You have muscle aches, chills, or a general ill feeling.  You have a fever. MAKE SURE YOU:   Understand these instructions.  Will watch your condition.  Will get help right away if you are not doing well or get worse. Document Released: 03/24/2005 Document Revised: 12/14/2011 Document Reviewed: 08/27/2011 [facility] Patient Information 2015 [place], [place]. This information is not intended to replace advice given to you by your health care provider. Make sure you discuss any questions you have with your health care provider.   It is important for your to take your antibiotics as prescribed. You may take the ibuprofen 4 associated pain and inflammation surrounding the incision site. Please change the dressing at least once a day and more frequently if it becomes dirty. He may wash the wound with warm soapy water. Please follow-up with your PCP within 48 hrs for wound recheck. Return to ED for worsening symptoms fevers, increased numbness or weakness, worsening redness or swelling.  Abscess Care After An abscess (also called a boil or furuncle) is an infected area that contains a collection of pus. Signs and symptoms of an abscess include pain, tenderness, redness, or hardness, or you may feel a moveable soft area  under your skin. An abscess can occur anywhere in the body. The infection may spread to surrounding tissues causing cellulitis. A cut (incision) by the surgeon was made over your abscess and the pus was drained out. Gauze may have been packed into the space to provide a drain that will allow the cavity to heal from the inside  outwards. The boil may be painful for 5 to 7 days. Most people with a boil do not have high fevers. Your abscess, if seen early, may not have localized, and may not have been lanced. If not, another appointment may be required for this if it does not get better on its own or with medications. HOME CARE INSTRUCTIONS   Only take over-the-counter or prescription medicines for pain, discomfort, or fever as directed by your caregiver.  When you bathe, soak and then remove gauze or iodoform packs at least daily or as directed by your caregiver. You may then wash the wound gently with mild soapy water. Repack with gauze or do as your caregiver directs. SEEK IMMEDIATE MEDICAL CARE IF:   You develop increased pain, swelling, redness, drainage, or bleeding in the wound site.  You develop signs of generalized infection including muscle aches, chills, fever, or a general ill feeling.  An oral temperature above 102 F (38.9 C) develops, not controlled by medication. See your caregiver for a recheck if you develop any of the symptoms described above. If medications (antibiotics) were prescribed, take them as directed. Document Released: 12/31/2004 Document Revised: 09/06/2011 Document Reviewed: 08/28/2007 [facility] Patient Information 2015 [place], [place]. This information is not intended to replace advice given to you by your health care provider. Make sure you discuss any questions you have with your health care provider.

## 2014-07-12 LAB — WOUND CULTURE
CULTURE: NO GROWTH
Gram Stain: NONE SEEN
SPECIAL REQUESTS: NORMAL

## 2014-07-28 ENCOUNTER — Emergency Department (HOSPITAL_COMMUNITY)
Admission: EM | Admit: 2014-07-28 | Discharge: 2014-07-28 | Disposition: A | Discharge status: Home / Self Care | Payer: Self-pay | Attending: Emergency Medicine | Admitting: Emergency Medicine

## 2014-07-28 ENCOUNTER — Encounter (HOSPITAL_COMMUNITY): Payer: Self-pay | Admitting: Emergency Medicine

## 2014-07-28 DIAGNOSIS — J45909 Unspecified asthma, uncomplicated: Secondary | ICD-10-CM | POA: Insufficient documentation

## 2014-07-28 DIAGNOSIS — S79912A Unspecified injury of left hip, initial encounter: Secondary | ICD-10-CM | POA: Insufficient documentation

## 2014-07-28 DIAGNOSIS — Y9389 Activity, other specified: Secondary | ICD-10-CM | POA: Insufficient documentation

## 2014-07-28 DIAGNOSIS — Z720 Tobacco use: Secondary | ICD-10-CM | POA: Insufficient documentation

## 2014-07-28 DIAGNOSIS — Y92410 Unspecified street and highway as the place of occurrence of the external cause: Secondary | ICD-10-CM | POA: Insufficient documentation

## 2014-07-28 DIAGNOSIS — Z8659 Personal history of other mental and behavioral disorders: Secondary | ICD-10-CM | POA: Insufficient documentation

## 2014-07-28 DIAGNOSIS — Y998 Other external cause status: Secondary | ICD-10-CM | POA: Insufficient documentation

## 2014-07-28 DIAGNOSIS — R5383 Other fatigue: Secondary | ICD-10-CM | POA: Insufficient documentation

## 2014-07-28 DIAGNOSIS — R531 Weakness: Secondary | ICD-10-CM | POA: Insufficient documentation

## 2014-07-28 DIAGNOSIS — M25552 Pain in left hip: Secondary | ICD-10-CM

## 2014-07-28 DIAGNOSIS — S0081XA Abrasion of other part of head, initial encounter: Secondary | ICD-10-CM | POA: Insufficient documentation

## 2014-07-28 DIAGNOSIS — F1210 Cannabis abuse, uncomplicated: Secondary | ICD-10-CM | POA: Insufficient documentation

## 2014-07-28 NOTE — ED Notes (Signed)
Entered pts room to d/c pt to home, saw monitoring equipment laying on the floor and no pt.  No belongings in the room.
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
## 2014-07-28 NOTE — ED Notes (Signed)
Pt has not returned to room 

## 2014-07-28 NOTE — Discharge Instructions (Signed)
Return to the emergency room with worsening of symptoms, new symptoms or with symptoms that are concerning, especially severe headache, visual or speech changes, weakness in face, arms or legs. Call to make an appointment with your primary care provider as soon as possible to follow-up. Read below information and follow-up recommendations.   Cannabis Use Disorder Cannabis use disorder is a mental disorder. It is not one-time or occasional use of cannabis, more commonly known as marijuana. Cannabis use disorder is the continued, nonmedical use of cannabis that interferes with normal life activities or causes health problems. People with cannabis use disorder get a feeling of extreme pleasure and relaxation from cannabis use. This "high" is very rewarding and causes people to use over and over.  The mind-altering ingredient in cannabis is know as THC. THC can also interfere with motor coordination, memory, judgment, and accurate sense of space and time. These effects can last for a few days after using cannabis. Regular heavy cannabis use can cause long-lasting problems with thinking and learning. In young people, these problems may be permanent. Cannabis sometimes causes severe anxiety, paranoia, or visual hallucinations. Man-made (synthetic) cannabis-like drugs, such as "spice" and "K2," cause the same effects as THC but are much stronger. Cannabis-like drugs can cause dangerously high blood pressure and heart rate.  Cannabis use disorder usually starts in the teenage years. It can trigger the development of schizophrenia. It is somewhat more common in men than women. People who have family members with the disorder or existing mental health issues such as depression and posttraumatic stress disorderare more likely to develop cannabis use disorder. People with cannabis use disorder are at higher risk for use of other drugs of abuse.  SIGNS AND SYMPTOMS Signs and symptoms of cannabis use disorder include:     Use of cannabis in larger amounts or over a longer period than intended.   Unsuccessful attempts to cut down or control cannabis use.   A lot of time spent obtaining, using, or recovering from the effects of cannabis.   A strong desire or urge to use cannabis (cravings).   Continued use of cannabis in spite of problems at work, school, or home because of use.   Continued use of cannabis in spite of relationship problems because of use.  Giving up or cutting down on important life activities because of cannabis use.  Use of cannabis over and over even in situations when it is physically hazardous, such as when driving a car.   Continued use of cannabis in spite of a physical problem that is likely related to use. Physical problems can include:  Chronic cough.  Bronchitis.  Emphysema.  Throat and lung cancer.  Continued use of cannabis in spite of a mental problem that is likely related to use. Mental problems can include:  Psychosis.  Anxiety.  Difficulty sleeping.  Need to use more and more cannabis to get the same effect, or lessened effect over time with use of the same amount (tolerance).  Having withdrawal symptoms when cannabis use is stopped, or using cannabis to reduce or avoid withdrawal symptoms. Withdrawal symptoms include:  Irritability or anger.  Anxiety or restlessness.  Difficulty sleeping.  Loss of appetite or weight.  Aches and pains.  Shakiness.  Sweating.  Chills. DIAGNOSIS Cannabis use disorder is diagnosed by your health care provider. You may be asked questions about your cannabis use and how it affects your life. A physical exam may be done. A drug screen may be done. You  may be referred to a mental health professional. The diagnosis of cannabis use disorder requires at least two symptoms within 12 months. The type of cannabis use disorder you have depends on the number of symptoms you have. The type may be:  Mild. Two or three  signs and symptoms.   Moderate. Four or five signs and symptoms.   Severe. Six or more signs and symptoms.  TREATMENT Treatment is usually provided by mental health professionals with training in substance use disorders. The following options are available:  Counseling or talk therapy. Talk therapy addresses the reasons you use cannabis. It also addresses ways to keep you from using again. The goals of talk therapy include:  Identifying and avoiding triggers for use.  Learning how to handle cravings.  Replacing use with healthy activities.  Support groups. Support groups provide emotional support, advice, and guidance.  Medicine. Medicine is used to treat mental health issues that trigger cannabis use or that result from it. HOME CARE INSTRUCTIONS  Take medicines only as directed by your health care provider.  Check with your health care provider before starting any new medicines.  Keep all follow-up visits as directed by your health care provider. SEEK MEDICAL CARE IF:  You are not able to take your medicines as directed.  Your symptoms get worse. SEEK IMMEDIATE MEDICAL CARE IF: You have serious thoughts about hurting yourself or others. FOR MORE INFORMATION  National Institute on Drug Abuse: http://www.price-smith.com/  Substance Abuse and Mental Health Services Administration: SkateOasis.com.pt Document Released: 06/11/2000 Document Revised: 10/29/2013 Document Reviewed: 06/27/2013 [facility] Patient Information 2015 [place], [place]. This information is not intended to replace advice given to you by your health care provider. Make sure you discuss any questions you have with your health care provider.
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
## 2014-12-14 ENCOUNTER — Emergency Department (HOSPITAL_COMMUNITY)
Admission: EM | Admit: 2014-12-14 | Discharge: 2014-12-14 | Disposition: A | Discharge status: Home / Self Care | Payer: Self-pay | Attending: Emergency Medicine | Admitting: Emergency Medicine

## 2014-12-14 ENCOUNTER — Encounter (HOSPITAL_COMMUNITY): Payer: Self-pay | Admitting: Emergency Medicine

## 2014-12-14 ENCOUNTER — Emergency Department (HOSPITAL_COMMUNITY): Payer: Self-pay

## 2014-12-14 DIAGNOSIS — F329 Major depressive disorder, single episode, unspecified: Secondary | ICD-10-CM | POA: Insufficient documentation

## 2014-12-14 DIAGNOSIS — T401X1A Poisoning by heroin, accidental (unintentional), initial encounter: Secondary | ICD-10-CM | POA: Insufficient documentation

## 2014-12-14 DIAGNOSIS — M25512 Pain in left shoulder: Secondary | ICD-10-CM

## 2014-12-14 DIAGNOSIS — S4992XA Unspecified injury of left shoulder and upper arm, initial encounter: Secondary | ICD-10-CM | POA: Insufficient documentation

## 2014-12-14 DIAGNOSIS — Y9289 Other specified places as the place of occurrence of the external cause: Secondary | ICD-10-CM | POA: Insufficient documentation

## 2014-12-14 DIAGNOSIS — Z720 Tobacco use: Secondary | ICD-10-CM | POA: Insufficient documentation

## 2014-12-14 DIAGNOSIS — W228XXA Striking against or struck by other objects, initial encounter: Secondary | ICD-10-CM | POA: Insufficient documentation

## 2014-12-14 DIAGNOSIS — Y9389 Activity, other specified: Secondary | ICD-10-CM | POA: Insufficient documentation

## 2014-12-14 DIAGNOSIS — J45909 Unspecified asthma, uncomplicated: Secondary | ICD-10-CM | POA: Insufficient documentation

## 2014-12-14 DIAGNOSIS — R000 Tachycardia, unspecified: Secondary | ICD-10-CM | POA: Insufficient documentation

## 2014-12-14 DIAGNOSIS — Y998 Other external cause status: Secondary | ICD-10-CM | POA: Insufficient documentation

## 2014-12-14 LAB — CBC
HEMATOCRIT: 42.3 % (ref 39.0–52.0)
Hemoglobin: 14.2 g/dL (ref 13.0–17.0)
MCH: 30.7 pg (ref 26.0–34.0)
MCHC: 33.6 g/dL (ref 30.0–36.0)
MCV: 91.4 fL (ref 78.0–100.0)
PLATELETS: 172 10*3/uL (ref 150–400)
RBC: 4.63 MIL/uL (ref 4.22–5.81)
RDW: 13.3 % (ref 11.5–15.5)
WBC: 15.7 10*3/uL — AB (ref 4.0–10.5)

## 2014-12-14 LAB — COMPREHENSIVE METABOLIC PANEL
ALK PHOS: 111 U/L (ref 38–126)
ALT: 24 U/L (ref 17–63)
ANION GAP: 10 (ref 5–15)
AST: 34 U/L (ref 15–41)
Albumin: 4 g/dL (ref 3.5–5.0)
BUN: 15 mg/dL (ref 6–20)
CHLORIDE: 105 mmol/L (ref 101–111)
CO2: 25 mmol/L (ref 22–32)
Calcium: 8.6 mg/dL — ABNORMAL LOW (ref 8.9–10.3)
Creatinine, Ser: 1.3 mg/dL — ABNORMAL HIGH (ref 0.61–1.24)
GFR calc non Af Amer: >60 mL/min (ref >60)
GLUCOSE: 116 mg/dL — AB (ref 65–99)
POTASSIUM: 4.1 mmol/L (ref 3.5–5.1)
Sodium: 140 mmol/L (ref 135–145)
Total Bilirubin: 0.6 mg/dL (ref 0.3–1.2)
Total Protein: 6.4 g/dL — ABNORMAL LOW (ref 6.5–8.1)

## 2014-12-14 LAB — CBG MONITORING, ED: GLUCOSE-CAPILLARY: 120 mg/dL — AB (ref 65–99)

## 2014-12-14 LAB — ACETAMINOPHEN LEVEL: Acetaminophen (Tylenol), Serum: <10 ug/mL — ABNORMAL LOW (ref 10–30)

## 2014-12-14 LAB — SALICYLATE LEVEL: Salicylate Lvl: <4 mg/dL (ref 2.8–30.0)

## 2014-12-14 MED ORDER — SODIUM CHLORIDE 0.9 % IV BOLUS (SEPSIS)
1000.0000 mL | Freq: Once | INTRAVENOUS | Status: AC
  Administered 2014-12-14: 1000 mL via INTRAVENOUS
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
## 2014-12-14 NOTE — ED Notes (Signed)
Detective at bedside

## 2014-12-14 NOTE — ED Notes (Signed)
Mother and grandmother at bedside.  

## 2014-12-14 NOTE — Discharge Instructions (Signed)
Radial Nerve Palsy Wrist drop is also known as radial nerve palsy. It is a condition in which you can not extend your wrist. This means if you are standing with your elbow bent at a right angle and with the top of your hand pointed at the ceiling, you can not hold your hand up. It falls toward the floor.  This action of extending your wrist is caused by the muscles in the back of your arm. These muscles are controlled by the radial nerve. This means that anything affecting the radial nerve so it can not tell the muscles how to work will cause wrist drop. This is medically called radial nerve palsy. Also the radial nerve is a motor and sensory nerve so anything affecting it causes problems with movement and feeling. CAUSES  Some more common causes of wrist drop are:  A break (fracture) of the large bone in the arm between your shoulder and your elbow (humerus). This is because the radial nerve winds around the humerus.  Improper use of crutches causes this because the radial nerve runs through the armpit (axilla). Crutches which are too long can put pressure on the nerve. This is sometimes called crutch palsy.  Falling asleep with your arm over a chair and supported on the back is a common cause. This is sometimes called Saturday Night Syndrome.  Wrist drop can be associated with lead poisoning because of the effect of lead on the radial nerve. SYMPTOMS  The wrist drop is an obvious problem, but there may also be numbness in the back of the arm, forearm or hand which provides feeling in these areas by the radial nerve. There can be difficulty straightening out the elbow in addition to the wrist. There may be numbness, tingling, pain, burning sensations or other abnormal feelings. Symptoms depend entirely on where the radial nerve is injured. DIAGNOSIS   Wrist drop is obvious just by looking at it. Your caregiver may make the diagnosis by taking your history and doing a couple tests.  One test which  may be done is a nerve conduction study. This test shows if the radial nerve is conducting signals well. If not, it can determine where the nerve problem is.  Sometimes X-ray studies are done. Your caregiver will determine if further testing needs to be done. TREATMENT   Usually if the problem is found to be pressure on the nerve, simply removing the pressure will allow the nerve to go back to normal in a few weeks to a few months. Other treatments will depend upon the cause found.  Only take over-the-counter or prescription medicines for pain, discomfort, or fever as directed by your caregiver.  Sometimes seizure medications are used.  Steroids are sometimes given to decrease swelling if it is thought to be a possible cause. Document Released: 02/18/2006 Document Revised: 09/06/2011 Document Reviewed: 08/29/2013 [employer] Patient Information 2015 [employer]. This information is not intended to replace advice given to you by your health care provider. Make sure you discuss any questions you have with your health care provider.  

## 2015-01-26 ENCOUNTER — Encounter (HOSPITAL_COMMUNITY): Payer: Self-pay | Admitting: Emergency Medicine

## 2015-01-26 ENCOUNTER — Emergency Department (HOSPITAL_COMMUNITY)
Admission: EM | Admit: 2015-01-26 | Discharge: 2015-01-26 | Disposition: A | Discharge status: Home / Self Care | Payer: Self-pay | Attending: Emergency Medicine | Admitting: Emergency Medicine

## 2015-01-26 DIAGNOSIS — Z720 Tobacco use: Secondary | ICD-10-CM | POA: Insufficient documentation

## 2015-01-26 DIAGNOSIS — J45909 Unspecified asthma, uncomplicated: Secondary | ICD-10-CM | POA: Insufficient documentation

## 2015-01-26 DIAGNOSIS — T401X1A Poisoning by heroin, accidental (unintentional), initial encounter: Secondary | ICD-10-CM | POA: Insufficient documentation

## 2015-01-26 DIAGNOSIS — Y9389 Activity, other specified: Secondary | ICD-10-CM | POA: Insufficient documentation

## 2015-01-26 DIAGNOSIS — Z8659 Personal history of other mental and behavioral disorders: Secondary | ICD-10-CM | POA: Insufficient documentation

## 2015-01-26 DIAGNOSIS — Y998 Other external cause status: Secondary | ICD-10-CM | POA: Insufficient documentation

## 2015-01-26 DIAGNOSIS — Y9289 Other specified places as the place of occurrence of the external cause: Secondary | ICD-10-CM | POA: Insufficient documentation

## 2015-01-26 NOTE — ED Notes (Signed)
Bed: RESA Expected date:  Expected time:  Means of arrival:  Comments: EMS/O.D. 

## 2015-01-26 NOTE — ED Notes (Signed)
Pt given dc instructions. Verbalized understanding.

## 2015-01-26 NOTE — ED Notes (Signed)
Pt ambulated to the bathroom without assistance and didn't have any difficulty

## 2015-01-26 NOTE — Discharge Instructions (Signed)
YOU NEED TO STOP DOING THE HEROIN!! Someday it will be too late for the narcan to work and you will die. Look at the substance abuse referral list to get outpatient help.     Narcotic Overdose A narcotic overdose is the misuse or overuse of a narcotic drug. A narcotic overdose can make you pass out and stop breathing. If you are not treated right away, this can cause permanent brain damage or stop your heart. Medicine may be given to reverse the effects of an overdose. If so, this medicine may bring on withdrawal symptoms. The symptoms may be abdominal cramps, throwing up (vomiting), sweating, chills, and nervousness. Injecting narcotics can cause more problems than just an overdose. AIDS, hepatitis, and other very serious infections are transmitted by sharing needles and syringes. If you decide to quit using, there are medicines which can help you through the withdrawal period. Trying to quit all at once on your own can be uncomfortable, but not life-threatening. Call your caregiver, Narcotics Anonymous, or any drug and alcohol treatment program for further help.  Document Released: 07/22/2004 Document Revised: 09/06/2011 Document Reviewed: 05/16/2009 [facility] Patient Information 2015 [place], [place]. This information is not intended to replace advice given to you by your health care provider. Make sure you discuss any questions you have with your health care provider.
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
## 2015-01-26 NOTE — ED Provider Notes (Signed)
CSN: [id number]     Arrival date & time 01/26/15  0258 History   First MD Initiated Contact with Patient 01/26/15 [phone]     Chief Complaint  Patient presents with  . Drug Overdose     (Consider location/radiation/quality/duration/timing/severity/associated sxs/prior Treatment) HPI patient reports he has been doing heroin for about 2 years. He has never been to detox. He does not think that heroin is a problem, he states he only does it "now and then". This is his third ED visit in one year for heroin overdose. His girlfriend called EMS tonight when he became unresponsive and EMS reports he was not breathing. He was given Narcan and Zofran.  PCP Dr Hildred Alamin  Past Medical History  Diagnosis Date  . Asthma   . Depression   . Heroin abuse    History reviewed. No pertinent past surgical history. History reviewed. No pertinent family history. History  Substance Use Topics  . Smoking status: Current Every Day Smoker -- 1.00 packs/day    Types: Cigarettes  . Smokeless tobacco: Not on file  . Alcohol Use: Yes     Comment: occ  employed Drinks a fifth on weekends Uses heroin "now and then"  Review of Systems  All other systems reviewed and are negative.     Allergies  Review of patient's allergies indicates no known allergies.  Home Medications   Prior to Admission medications   Medication Sig Start Date End Date Taking? Authorizing Provider  amphetamine-dextroamphetamine (ADDERALL) 20 MG tablet TAKE 1 TABLET EVERY MORNING AND 1 AT NOON 11/20/14   Historical Provider, MD  cephALEXin (KEFLEX) 500 MG capsule Take 1 capsule (500 mg total) by mouth 4 (four) times daily. Patient not taking: Reported on 07/28/2014 07/09/14   Joycie Peek, PA-C  clonazePAM (KLONOPIN) 1 MG tablet Take 1 mg by mouth daily as needed for anxiety.    Historical Provider, MD  ibuprofen (ADVIL,MOTRIN) 800 MG tablet Take 1 tablet (800 mg total) by mouth 3 (three) times daily. Patient not taking: Reported on  07/28/2014 07/09/14   Joycie Peek, PA-C  naproxen (NAPROSYN) 500 MG tablet Take 1 tablet (500 mg total) by mouth 2 (two) times daily with a meal. Patient not taking: Reported on 07/28/2014 05/26/14   Eber Hong, MD  sulfamethoxazole-trimethoprim (SEPTRA DS) 800-160 MG per tablet Take 1 tablet by mouth every 12 (twelve) hours. Patient not taking: Reported on 07/28/2014 05/26/14   Eber Hong, MD   BP 116/63 mmHg  Pulse 108  Temp(Src) 99.1 F (37.3 C) (Oral)  Resp 16  SpO2 93%  Vital signs normal except tachycardia  Physical Exam  Constitutional: He is oriented to person, place, and time. He appears well-developed and well-nourished.  Non-toxic appearance. He does not appear ill. No distress.  HENT:  Head: Normocephalic and atraumatic.  Right Ear: External ear normal.  Left Ear: External ear normal.  Nose: Nose normal. No mucosal edema or rhinorrhea.  Mouth/Throat: Oropharynx is clear and moist and mucous membranes are normal. No dental abscesses or uvula swelling.  Eyes: Conjunctivae and EOM are normal. Pupils are equal, round, and reactive to light.  Neck: Normal range of motion and full passive range of motion without pain. Neck supple.  Cardiovascular: Normal rate, regular rhythm and normal heart sounds.  Exam reveals no gallop and no friction rub.   No murmur heard. Pulmonary/Chest: Effort normal and breath sounds normal. No respiratory distress. He has no wheezes. He has no rhonchi. He has no rales. He exhibits no  tenderness and no crepitus.  Abdominal: Soft. Normal appearance and bowel sounds are normal. He exhibits no distension. There is no tenderness. There is no rebound and no guarding.  Musculoskeletal: Normal range of motion. He exhibits no edema or tenderness.  Moves all extremities well.   Neurological: He is alert and oriented to person, place, and time. He has normal strength. No cranial nerve deficit.  Skin: Skin is warm, dry and intact. No rash noted. No erythema.  No pallor.  No infected injection sites  Psychiatric: He has a normal mood and affect. His mood appears not anxious. His speech is delayed. He is slowed.  Nursing note and vitals reviewed.   ED Course  Procedures (including critical care time)  I talked to the patient about how dangerous his overdose was. We discussed that once she stopped breathing there comes a point where the heart also stops beating and he will die. I asked him what point he is going to be concerned enough to stop using the heroin. Patient seems unconcerned.  I initially told the patient we would observe him for 2 hours and he would be discharged if he was doing  well. However patient fell asleep and he was becoming hypoxic. He was placed on oxygen and was difficult to awaken.  Patient's grandmother arrived and is very tearful. She states she has arranged for patient to go to detox however he refuses to go. She also told the nurse this is his eighth overdose from heroin in the past year. He has been going to several different hospitals so we only have 2 in the past month and a total of 3 in the past year.  6:15 AM patient is awake. I talked to him that if he stays awake and keeps his oxygen up he will be ready for discharge.  7:10 AM nurses report patient has been awake. He's been ambulatory in no distress. His pulse ox has remained good. He is ready for discharge.   Medications - No data to display  Labs Review Labs Reviewed - No data to display  Imaging Review No results found.   EKG Interpretation   Date/Time:  Sunday January 26 2015 03:04:18 EDT Ventricular Rate:  106 PR Interval:  134 QRS Duration: 79 QT Interval:  333 QTC Calculation: 442 R Axis:   80 Text Interpretation:  Sinus tachycardia No significant change since last  tracing 14 Dec 2014 Confirmed by Cyruss Arata  MD-I, Daryn Pisani ([id number]) on 01/26/2015  3:17:54 AM      MDM   Final diagnoses:  Heroin overdose, accidental or unintentional, initial encounter     Plan discharge  Devoria Albe, MD, Concha Pyo, MD 01/26/15 [phone]

## 2016-06-23 ENCOUNTER — Emergency Department (HOSPITAL_COMMUNITY): Payer: Self-pay

## 2016-06-23 ENCOUNTER — Encounter (HOSPITAL_COMMUNITY): Payer: Self-pay | Admitting: Emergency Medicine

## 2016-06-23 ENCOUNTER — Inpatient Hospital Stay (HOSPITAL_COMMUNITY)
Admission: EM | Admit: 2016-06-23 | Discharge: 2016-06-25 | DRG: 871 | Disposition: A | Discharge status: Home / Self Care | Payer: BLUE CROSS/BLUE SHIELD | Attending: Internal Medicine | Admitting: Internal Medicine

## 2016-06-23 DIAGNOSIS — G9340 Encephalopathy, unspecified: Secondary | ICD-10-CM | POA: Diagnosis not present

## 2016-06-23 DIAGNOSIS — J690 Pneumonitis due to inhalation of food and vomit: Secondary | ICD-10-CM | POA: Diagnosis not present

## 2016-06-23 DIAGNOSIS — T401X1A Poisoning by heroin, accidental (unintentional), initial encounter: Secondary | ICD-10-CM | POA: Diagnosis not present

## 2016-06-23 DIAGNOSIS — F1990 Other psychoactive substance use, unspecified, uncomplicated: Secondary | ICD-10-CM | POA: Diagnosis not present

## 2016-06-23 DIAGNOSIS — J189 Pneumonia, unspecified organism: Secondary | ICD-10-CM

## 2016-06-23 DIAGNOSIS — A419 Sepsis, unspecified organism: Principal | ICD-10-CM | POA: Diagnosis present

## 2016-06-23 DIAGNOSIS — F329 Major depressive disorder, single episode, unspecified: Secondary | ICD-10-CM | POA: Diagnosis present

## 2016-06-23 DIAGNOSIS — F17210 Nicotine dependence, cigarettes, uncomplicated: Secondary | ICD-10-CM | POA: Diagnosis present

## 2016-06-23 DIAGNOSIS — F1290 Cannabis use, unspecified, uncomplicated: Secondary | ICD-10-CM | POA: Diagnosis present

## 2016-06-23 LAB — COMPREHENSIVE METABOLIC PANEL
ALK PHOS: 96 U/L (ref 38–126)
ALT: 153 U/L — ABNORMAL HIGH (ref 17–63)
AST: 112 U/L — ABNORMAL HIGH (ref 15–41)
Albumin: 4.1 g/dL (ref 3.5–5.0)
Anion gap: 10 (ref 5–15)
BUN: 23 mg/dL — ABNORMAL HIGH (ref 6–20)
CALCIUM: 9 mg/dL (ref 8.9–10.3)
CHLORIDE: 101 mmol/L (ref 101–111)
CO2: 28 mmol/L (ref 22–32)
Creatinine, Ser: 1.13 mg/dL (ref 0.61–1.24)
GFR calc non Af Amer: >60 mL/min (ref >60)
Glucose, Bld: 142 mg/dL — ABNORMAL HIGH (ref 65–99)
POTASSIUM: 4.1 mmol/L (ref 3.5–5.1)
SODIUM: 139 mmol/L (ref 135–145)
Total Bilirubin: 0.5 mg/dL (ref 0.3–1.2)
Total Protein: 6.5 g/dL (ref 6.5–8.1)

## 2016-06-23 LAB — URINALYSIS, ROUTINE W REFLEX MICROSCOPIC
BILIRUBIN URINE: NEGATIVE
Glucose, UA: NEGATIVE mg/dL
HGB URINE DIPSTICK: NEGATIVE
Ketones, ur: NEGATIVE mg/dL
Nitrite: NEGATIVE
Protein, ur: NEGATIVE mg/dL
Specific Gravity, Urine: 1.024 (ref 1.005–1.030)
pH: 5 (ref 5.0–8.0)

## 2016-06-23 LAB — CBC WITH DIFFERENTIAL/PLATELET
BASOS PCT: 0 %
Basophils Absolute: 0 10*3/uL (ref 0.0–0.1)
EOS ABS: 0 10*3/uL (ref 0.0–0.7)
EOS PCT: 0 %
HEMATOCRIT: 36.7 % — AB (ref 39.0–52.0)
HEMOGLOBIN: 12.3 g/dL — AB (ref 13.0–17.0)
Lymphocytes Relative: 5 %
Lymphs Abs: 0.6 10*3/uL — ABNORMAL LOW (ref 0.7–4.0)
MCH: 31.2 pg (ref 26.0–34.0)
MCHC: 33.5 g/dL (ref 30.0–36.0)
MCV: 93.1 fL (ref 78.0–100.0)
Monocytes Absolute: 0.4 10*3/uL (ref 0.1–1.0)
Monocytes Relative: 3 %
Neutro Abs: 10 10*3/uL — ABNORMAL HIGH (ref 1.7–7.7)
Neutrophils Relative %: 92 %
Platelets: 166 10*3/uL (ref 150–400)
RBC: 3.94 MIL/uL — ABNORMAL LOW (ref 4.22–5.81)
RDW: 13.5 % (ref 11.5–15.5)
WBC: 10.9 10*3/uL — AB (ref 4.0–10.5)

## 2016-06-23 LAB — ETHANOL: Alcohol, Ethyl (B): <5 mg/dL (ref <5)

## 2016-06-23 LAB — SALICYLATE LEVEL: Salicylate Lvl: <7 mg/dL (ref 2.8–30.0)

## 2016-06-23 LAB — RAPID URINE DRUG SCREEN, HOSP PERFORMED
Amphetamines: NOT DETECTED
Barbiturates: NOT DETECTED
Benzodiazepines: NOT DETECTED
COCAINE: NOT DETECTED
OPIATES: POSITIVE — AB
TETRAHYDROCANNABINOL: POSITIVE — AB

## 2016-06-23 LAB — ACETAMINOPHEN LEVEL: Acetaminophen (Tylenol), Serum: <10 ug/mL — ABNORMAL LOW (ref 10–30)

## 2016-06-23 LAB — I-STAT CG4 LACTIC ACID, ED
Lactic Acid, Venous: 2.52 mmol/L (ref 0.5–1.9)
Lactic Acid, Venous: 0.64 mmol/L (ref 0.5–1.9)

## 2016-06-23 MED ORDER — SODIUM CHLORIDE 0.9 % IV BOLUS (SEPSIS)
250.0000 mL | Freq: Once | INTRAVENOUS | Status: AC
  Administered 2016-06-23: 250 mL via INTRAVENOUS

## 2016-06-23 MED ORDER — PIPERACILLIN-TAZOBACTAM 3.375 G IVPB 30 MIN
3.3750 g | Freq: Once | INTRAVENOUS | Status: AC
  Administered 2016-06-23: 3.375 g via INTRAVENOUS
  Filled 2016-06-23: qty 50

## 2016-06-23 MED ORDER — ACETAMINOPHEN 325 MG PO TABS: 650.0000 mg | ORAL_TABLET | Freq: Four times a day (QID) | ORAL | Status: DC | PRN

## 2016-06-23 MED ORDER — VANCOMYCIN HCL IN DEXTROSE 1-5 GM/200ML-% IV SOLN
1000.0000 mg | Freq: Once | INTRAVENOUS | Status: AC
  Administered 2016-06-23: 1000 mg via INTRAVENOUS
  Filled 2016-06-23: qty 200

## 2016-06-23 MED ORDER — ACETAMINOPHEN 325 MG PO TABS
325.0000 mg | ORAL_TABLET | Freq: Once | ORAL | Status: AC
  Administered 2016-06-23: 325 mg via ORAL
  Filled 2016-06-23: qty 1

## 2016-06-23 MED ORDER — NALOXONE HCL 0.4 MG/ML IJ SOLN
0.4000 mg | Freq: Once | INTRAMUSCULAR | Status: AC
  Administered 2016-06-23: 0.4 mg via INTRAVENOUS
  Filled 2016-06-23: qty 1

## 2016-06-23 MED ORDER — SODIUM CHLORIDE 0.9 % IV BOLUS (SEPSIS)
1000.0000 mL | Freq: Once | INTRAVENOUS | Status: AC
  Administered 2016-06-23: 1000 mL via INTRAVENOUS

## 2016-06-23 MED ORDER — SODIUM CHLORIDE 0.9% FLUSH
3.0000 mL | Freq: Two times a day (BID) | INTRAVENOUS | Status: DC
  Administered 2016-06-23: 3 mL via INTRAVENOUS

## 2016-06-23 MED ORDER — SODIUM CHLORIDE 0.9 % IV BOLUS (SEPSIS): 250.0000 mL | Freq: Once | INTRAVENOUS | Status: DC

## 2016-06-23 MED ORDER — PIPERACILLIN-TAZOBACTAM 3.375 G IVPB
3.3750 g | Freq: Three times a day (TID) | INTRAVENOUS | Status: DC
  Administered 2016-06-23 – 2016-06-24 (×3): 3.375 g via INTRAVENOUS
  Filled 2016-06-23 (×3): qty 50

## 2016-06-23 MED ORDER — SODIUM CHLORIDE 0.9 % IV BOLUS (SEPSIS): 30.0000 mL/kg | Freq: Once | INTRAVENOUS | Status: DC

## 2016-06-23 MED ORDER — ACETAMINOPHEN 650 MG RE SUPP: 650.0000 mg | Freq: Four times a day (QID) | RECTAL | Status: DC | PRN

## 2016-06-23 MED ORDER — VANCOMYCIN HCL IN DEXTROSE 750-5 MG/150ML-% IV SOLN
750.0000 mg | Freq: Three times a day (TID) | INTRAVENOUS | Status: DC
  Administered 2016-06-23 – 2016-06-24 (×3): 750 mg via INTRAVENOUS
  Filled 2016-06-23 (×5): qty 150

## 2016-06-23 MED ORDER — SODIUM CHLORIDE 0.9 % IV SOLN
INTRAVENOUS | Status: DC
Start: 2016-06-23 — End: 2016-06-25
  Administered 2016-06-23 – 2016-06-25 (×6): via INTRAVENOUS

## 2016-06-23 NOTE — ED Notes (Signed)
Bed: WA18 Expected date:  Expected time:  Means of arrival:  Comments: 

## 2016-06-23 NOTE — ED Provider Notes (Signed)
MSE was initiated and I personally evaluated the patient and placed orders (if any) at  6:15 AM on June 23, 2016.  Physical Exam  Constitutional: No distress.  HENT:  Head: Normocephalic and atraumatic.  Mouth/Throat: Oropharynx is clear and moist.  Eyes: Conjunctivae and EOM are normal. Pupils are equal, round, and reactive to light.  Neck: Normal range of motion. Neck supple.  Cardiovascular: Regular rhythm, normal heart sounds and intact distal pulses.  Tachycardia present.   Pulmonary/Chest: Effort normal. He has wheezes.  Patient shows no increased work of breathing. Handling oral secretions without difficulty. Maintaining his airway without difficulty.  Abdominal: Soft. He exhibits no distension. There is no tenderness.  Musculoskeletal: He exhibits no edema.  Lymphadenopathy:    He has no cervical adenopathy.  Neurological:  Upon my initial exam, patient was somnolent, but arousable. Following an additional 0.4 mg Narcan, he is spontaneously alert and oriented.  Skin: Skin is warm and dry. There is pallor.  Nursing note and vitals reviewed.   Pt arrives via EMS for a reported heroin overdose. Pt was reportedly found by his mother in bed, unconscious, with a needle in his right arm. Mother gave the patient 4 mg Narcan IN. Patient conscious, but disoriented with EMS.  Patient admits to having a productive cough with yellow sputum for the last week and a half. He does not remember the last time he ate or drink. Patient admits to using heroin last night as well as 2 mg of Xanax. Denies additional drug use. Denies alcohol use.  Patient is febrile, hypotensive, tachycardic, and elevated lactate. Code sepsis initiated. Heroin may be contributing to some of these, but pt still qualifies as sepsis.   The patient appears stable so that the remainder of the MSE may be completed by another provider.   Anselm PancoastShawn C Mirage Pfefferkorn, PA-C 06/23/16 [phone]    Anselm PancoastShawn C Jamarquis Crull, PA-C 06/23/16 [phone]    Geoffery Lyonsouglas  Delo, MD 06/24/16 [phone]

## 2016-06-23 NOTE — Progress Notes (Signed)
ED CM paged Dr Irene LimboGoodrich admitting MD

## 2016-06-23 NOTE — ED Notes (Signed)
Pt provided Malaw[place]key sandwich, chicken noodle soup & coke.

## 2016-06-23 NOTE — ED Notes (Signed)
Dr. Judd Lienelo examines pt. At this time.

## 2016-06-23 NOTE — ED Notes (Signed)
He arouses easily from sleep and tells me he has no c/o nor requests at this time. He is currently on his 3rd liter of IVF. Monitor shows sinus tach. Without ectopy.

## 2016-06-23 NOTE — ED Notes (Signed)
His mom had arrived ~30 min. Ago; at which time she spoke with the hospitalist. Also, I had spent much time explaining pt. Condition and treatment, including all of the treatment measures we are performing for a possible infection. She thanks us for our care.

## 2016-06-23 NOTE — Progress Notes (Signed)
ED nursing director spoke with ED CM about this pt- ?d/c

## 2016-06-23 NOTE — H&P (Addendum)
History and Physical  Chad James NWG:[id number] DOB: Jun 30, 1990 DOA: 06/23/2016  PCP: Herb GraysSPEAR, TAMMY, MD (Inactive)  Patient coming from: home  Chief Complaint: cough (found unresponsive)  HPI:  25 year old man PMH drug abuse just released from "detox" presented to the hospital after being found unresponsive at home with a syringe. In the emergency department afebrile, hypotensive, elevated lactic acid. Sepsis protocol followed, evaluated by critical care, given clinical improvement and resolution of elevated lactic acid, patient to be admitted by hospitalist service.  Mother of patient at bedside. Patient gave explicit permission to interview mother in his presence. She reports hearing strange gurgling sounds, she went into his bedroom and found him unresponsive, turning blue, gurgling respirations. She reports there was a needle in his hand. She administered Narcan intranasally which had minimal effect. EMS was called.   Patient began arguing with his mother, mother left room. Patient interviewed privately, reports previous drug use with needles but none in the last week. Reports was released last week from drug rehabilitation. He reports last evening 12/26 he went out and bought a dose of Xanax off the street for sleep. He denies any drug use. He does admit to having syringes in his room and other paraphernalia but denies recent use. He denies any other drugs except marijuana. No alcohol. He denies any desire to hurt or kill himself. He's been sick for a week with cough and congestion. Yesterday 12/26 he had chills, body aches and cough. No definite fever. Felt very poorly yesterday. No specific aggravating or alleviating factors. He does not recall any other details but is adamant they said no recent drug use. He reports a poor relationship with his mother.  Veracity unclear.  Mother then later stopped me outside room, showed me 2 notebooks in which there were notes unsigned, included some  language suggesting "everything would be easier now" , potentially the look of a suicide note. However, these nodes are unsigned and undated.   Patient denies suicidal ideation or intent to harm himself.   ED Course: Temperature 102.3  , SBP 70-90s. Respiration 20s. Heart rate 92-1 25.  Pertinent labs: Lactic acid 2.5 >> 0.64. Tylenol, salicylate levels negative. Alcohol level negative. WBC 10.9. CMP unremarkable. EKG: Independently reviewed. Sinus tachycardia, right axis deviation.  Imaging:Chest x-ray and apparently reviewed, no acute disease.  Review of Systems:  Negative for fever, visual changes, rash, chest pain, shortness of breath, dysuria, bleeding, n/v/abdominal pain  Positive for chills, sore throat, muscle aches  Past Medical History:  Diagnosis Date  . Asthma   . Depression   . Heroin abuse     History reviewed. No pertinent surgical history.   reports that he has been smoking Cigarettes.  He has been smoking about 1.00 pack per day. He has never used smokeless tobacco. He reports that he drinks alcohol. He reports that he uses drugs, including Marijuana and Cocaine. Ambulatory  No Known Allergies  No family history on file.    Prior to Admission medications   Not on File    Physical Exam: Vitals:   06/23/16 0921 06/23/16 0930 06/23/16 0945 06/23/16 1026  BP: 90/57 (!) 80/58 92/59 (!) 87/52  Pulse: 99 94 92 98  Resp: 16 16 17 20   Temp: 99.2 F (37.3 C)     TempSrc: Oral     SpO2: 96% 97% 98% 95%  Weight:      Height:        Constitutional:  . Appears calm and comfortable, Completely alert,  answers questions appropriately  . Eyes: Pupils  and irises appear normal . Pupils and lids ENMT:  . external ears, nose appear normal . grossly normal hearing . Lips appear normal Neck:  . neck appears normal . no thyromegaly Respiratory:  . CTA bilaterally, no w/r/r.  . Respiratory effort normal. No retractions or accessory muscle use Cardiovascular:   . RRR, no m/r/g . No LE extremity edema   Abdomen:  . Abdomen appears normal; no tenderness or masses . No hernias noted Musculoskeletal:  . RUE, LUE, RLE, LLE   o strength and tone normal, no atrophy, no abnormal movements o No tenderness, masses Skin:  . Nolesions or ulcers  . palpation of skin: no induration or nodules Psychiatric:  . judgement and insightdifficult to assess, appear poor  . Mental status o Orientation to person, place, time  o Mood angry--, affect difficult to assess   Wt Readings from Last 3 Encounters:  06/23/16 68 kg (150 lb)  07/28/14 59 kg (130 lb)  07/09/14 59.1 kg (130 lb 5 oz)    I have personally reviewed following labs and imaging studies  Labs on Admission:  CBC:  Recent Labs Lab 06/23/16 0545  WBC 10.9*  NEUTROABS 10.0*  HGB 12.3*  HCT 36.7*  MCV 93.1  PLT 166   Basic Metabolic Panel:  Recent Labs Lab 06/23/16 0545  NA 139  K 4.1  CL 101  CO2 28  GLUCOSE 142*  BUN 23*  CREATININE 1.13  CALCIUM 9.0   Liver Function Tests:  Recent Labs Lab 06/23/16 0545  AST 112*  ALT 153*  ALKPHOS 96  BILITOT 0.5  PROT 6.5  ALBUMIN 4.1    Radiological Exams on Admission: Dg Chest 2 View  Result Date: 06/23/2016 CLINICAL DATA:  Drug overdose.  Fever. EXAM: CHEST  2 VIEW COMPARISON:  05/31/2014 FINDINGS: The cardiomediastinal contours are normal. The lungs are clear. Pulmonary vasculature is normal. No consolidation, pleural effusion, or pneumothorax. No acute osseous abnormalities are seen. IMPRESSION: No active cardiopulmonary disease. Electronically Signed   By: Rubye OaksMelanie  Ehinger M.D.   On: 06/23/2016 06:06     Principal Problem:   Sepsis (HCC) Active Problems:   Aspiration pneumonia (HCC)   Acute encephalopathy   IVDU (intravenous drug user)   Assessment/Plan 1. Suspected sepsis with fever, hypotension, tachycardia. History of cough, chills, unresponsiveness suggest pneumonia as source, concern for  aspiration.  Remains hypotensive but lactic acid has cleared. He does not appear toxic.  Broad-spectrum antibiotics, to include anaerobic coverage.  Aggressive IV fluids. No indication for vasopressors at this point.  Appreciate critical care evaluation at this time.   Follow-up culture data. 2. Suspected pneumonia, possibly aspiration.  IV antibiotics 3. Acute encephalopathy. Appears resolved.  Secondary to drug use? Acute medical illness. 4. IV drug use  Screen HIV. Social work consult.  Check urine drug screen 5. Possible suicide attempt  Patient categorically denies any intent to harm himself. However mother presented notes both of which suggest possible intent to harm, if his notes.  Psychiatry consultation has been placed to assess for overdose and intent in need for any further psychiatric stabilization  Safety sitter   ADDENDUM 1452 Patient improving, SBP 90s. Appears calm, comfortable. No complaints. Will downgrade to telemetry. Continue plan as above.  ADDENDUM LATE ENTRY RN reported patiented wanted to leave I d/w him rationale for treatment and need to stay for medical stabilization and psych input. He agrees to remain voluntarily.  DVT prophylaxis:SCDs Code Status: full  code Family Communication: mother with patient's permission  It is my clinical opinion that admission to INPATIENT is reasonable and necessary in this patient . presenting with symptoms of fever, hypotension, cough, concerning for sepsis, pneumonia  . in the context of PMH including: IV drug use   Given the aforementioned, the predictability of an adverse outcome is felt to be significant. I expect that the patient will require at least 2 midnights in the hospital to treat this condition.   Time spent: 60 minutes  Brendia Sacks, MD  [facility] Direct contact: [phone] --Via amion app OR  --[email or web address]; password [email or web address]  7PM-7AM contact night coverage as  above  06/23/2016, 10:46 AM

## 2016-06-23 NOTE — Progress Notes (Signed)
ED CM and ED Charge RN at ED nursing station when Dr Irene LimboGoodrich visited pt and came out  Dr Irene LimboGoodrich informed ED charge RN pt could be "downgraded not discharged"

## 2016-06-23 NOTE — Consult Note (Addendum)
PULMONARY / CRITICAL CARE MEDICINE   Name: Chad James MRN: [id number] DOB: 1991-01-24    ADMISSION DATE:  06/23/2016 CONSULTATION DATE:  06/23/16  REFERRING MD:  EDP  CHIEF COMPLAINT:  Heroin OD, hypotension  HISTORY OF PRESENT ILLNESS:   25 year old with past medical history of asthma, depression, drug abuse. He was found unresponsive at home with a needle stuck in his right arm. Is given 4 mg Narcan intranasally by his mother and EMS called. In the ED he appears confused but is protecting his airway. He was hypotensive, tachycardic, febrile with mildly elevated lactic acid. PCCM called for evaluation.  So far he has received about 3 L of fluid. He is getting his fourth liter with improvement in blood pressure to systolic greater than 90 and map greater then 65. Tachycardia appears to be resolving. He has received Vanco and Zosyn and blood cultures in the ED.  PAST MEDICAL HISTORY :  He  has a past medical history of Asthma; Depression; and Heroin abuse.  PAST SURGICAL HISTORY: He  has no past surgical history on file.  No Known Allergies  No current facility-administered medications on file prior to encounter.    No current outpatient prescriptions on file prior to encounter.    FAMILY HISTORY:  His has no family status information on file.    SOCIAL HISTORY: He  reports that he has been smoking Cigarettes.  He has been smoking about 1.00 pack per day. He has never used smokeless tobacco. He reports that he drinks alcohol. He reports that he uses drugs, including Marijuana and Cocaine.  REVIEW OF SYSTEMS:   Unable to obtain as patient is confused  SUBJECTIVE:    VITAL SIGNS: BP 90/57   Pulse 99   Temp 99.2 F (37.3 C) (Oral)   Resp 16   Ht 6\' 2"  (1.88 m)   Wt 150 lb (68 kg)   SpO2 96%   BMI 19.26 kg/m   HEMODYNAMICS:    VENTILATOR SETTINGS:    INTAKE / OUTPUT: No intake/output data recorded.  PHYSICAL EXAMINATION: General:  No distress, somnolent  but arousable Neuro: No gross focal deficits HEENT:  PERRLA, no thyromegaly, JVD, dry mucous membranes Cardiovascular:  Regular rate, tachycardia, no murmurs rubs gallops Lungs:  Clear to auscultation, no wheeze, crackles Abdomen:  Soft, positive bowel sounds Musculoskeletal:  Normal tone and bulk, trace edema in lower extremities   LABS:  BMET  Recent Labs Lab 06/23/16 0545  NA 139  K 4.1  CL 101  CO2 28  BUN 23*  CREATININE 1.13  GLUCOSE 142*    Electrolytes  Recent Labs Lab 06/23/16 0545  CALCIUM 9.0    CBC  Recent Labs Lab 06/23/16 0545  WBC 10.9*  HGB 12.3*  HCT 36.7*  PLT 166    Coag's No results for input(s): APTT, INR in the last 168 hours.  Sepsis Markers  Recent Labs Lab 06/23/16 0551  LATICACIDVEN 2.52*    ABG No results for input(s): PHART, PCO2ART, PO2ART in the last 168 hours.  Liver Enzymes  Recent Labs Lab 06/23/16 0545  AST 112*  ALT 153*  ALKPHOS 96  BILITOT 0.5  ALBUMIN 4.1    Cardiac Enzymes No results for input(s): TROPONINI, PROBNP in the last 168 hours.  Glucose No results for input(s): GLUCAP in the last 168 hours.  Imaging Dg Chest 2 View  Result Date: 06/23/2016 CLINICAL DATA:  Drug overdose.  Fever. EXAM: CHEST  2 VIEW COMPARISON:  05/31/2014 FINDINGS: The  cardiomediastinal contours are normal. The lungs are clear. Pulmonary vasculature is normal. No consolidation, pleural effusion, or pneumothorax. No acute osseous abnormalities are seen. IMPRESSION: No active cardiopulmonary disease. Electronically Signed   By: Rubye OaksMelanie  Ehinger M.D.   On: 06/23/2016 06:06     STUDIES:  CXR 12/27 > No acute cardiopulmonary abnormality  CULTURES: Bcx 12/27 > Pending  ANTIBIOTICS: Vanco 12/27 > Zosyn 12/27  SIGNIFICANT EVENTS:   LINES/TUBES:   DISCUSSION: 25 year old with heroin overdose. Also with elevated lactic acid, tachycardia, hypertension, fevers concerning for sepsis of unclear source  ASSESSMENT /  PLAN:  PULMONARY A: No issues P:   Supplemental O2  CARDIOVASCULAR A:  Sinus tachycardia Elevated LA P:  Continue IVF @125cc /hr Follow LA  RENAL A:   No issues P:   Follow urine output and Cr  GASTROINTESTINAL A:   Stable P:   Start diet when mental status improves.  HEMATOLOGIC A:   Stable P:   Monitor CBC  INFECTIOUS A:   Concern for sepsis from unclear source P:  Follow cultures Continue vanco, zosyn.  Check Pct Check UA  Sepsis - Repeat Assessment  Performed at:    10:00 AM  Vitals     Blood pressure 92/59, pulse 92, temperature 99.2 F (37.3 C), temperature source Oral, resp. rate 17, height 6\' 2"  (1.88 m), weight 150 lb (68 kg), SpO2 98 %.  Heart:     Regular rate and rhythm and Tachycardic  Lungs:    CTA  Capillary Refill:   <2 sec  Peripheral Pulse:   Radial pulse palpable  Skin:     Normal Color    ENDOCRINE A:   No issues P:     NEUROLOGIC A:   Confusion in setting of heroin OD P:   UDS Monitor mental status  FAMILY  - Updates: Mother updated at bedside - Inter-disciplinary family meet or Palliative Care meeting due by: 07/01/15  Chilton GreathousePraveen Elynore Dolinski MD [facility] Pager [phone] If no answer or after 3pm call: [phone] 06/23/2016, 10:03 AM

## 2016-06-23 NOTE — Progress Notes (Signed)
ED CM confirmed with pt that his pcp is still Herb Graysammy Spear and he informed CM he felt better as he went back to sleep

## 2016-06-23 NOTE — ED Triage Notes (Signed)
Pt brought from home via EMS for heroin overdose.  Pt's mother found him in bed unconscious with a needle in his R arm.  Hx rehab one year ago, has been using since.  Mom gave him 4mg  Narcan IN.  Pt is now conscious, alert when spoken to but disoriented to time/date.  Airway clear and breathing regular.  Attempted an IV on left side but patient started yelling and crying. VS: 110/56, 130 bpm, 95% RA and 98% [place] 2L, 30RR

## 2016-06-23 NOTE — ED Notes (Signed)
RN Warden FillersLIZA ACCIDENTALLY DID PT'S TRIAGE UNDER TECH JEREMY'S NAME.  JEREMY IS NO RESPONSIBLE FOR PT'S TRIAGE INFORMATION.  APOLOGIES.

## 2016-06-23 NOTE — ED Notes (Signed)
Lunch tray given at this time, which is received with enthusiasm.

## 2016-06-23 NOTE — ED Notes (Signed)
After a brief visit from his grandmother; he becomes very agitated and tells us he is "going to leave!". Dr. Irene LimboGoodrich notified of this and our secretary gives him IVC papers to initiate the process. Security are here quickly to insure he doesn't leave.

## 2016-06-23 NOTE — ED Notes (Signed)
He is now much more alert, although he remains mildly drowsy. He is calmly visiting with his grandfather. His only c/o is of being hungry, so I have our sec'y. Order him a lunch tray.

## 2016-06-23 NOTE — ED Notes (Signed)
Pt knows that he needs to give urine sample when possible.

## 2016-06-23 NOTE — ED Notes (Signed)
Patient given cup of water and urinal, encouraged to give sample. Patient is drinking water and attempting to use urinal at this time.

## 2016-06-23 NOTE — ED Notes (Signed)
He has agreed to stay as a voluntary pt. He maintains that he was erroneously given Narcan by his ostensibly overly concerned mother. His mother told me earlier that pt. Was found "with a needle on his chest and a bottle cap where he mixed 'the stuff'". She further told me that she had taken an extensive leave from her work to monitor pt. 24 hours a day as he awaits acceptance at an inpatient drug rehab.

## 2016-06-23 NOTE — ED Notes (Signed)
We have just heard from Dr. Irene LimboGoodrich that pt. may go to tele in lieu of stepdown. Pt. Continues to be in no distress.

## 2016-06-23 NOTE — Progress Notes (Signed)
Pharmacy Antibiotic Note  Tiney RougeWalter D Kaneshiro is a 25 y.o. male admitted on 06/23/2016 with sepsis.  Pharmacy has been consulted for vancomycin/Zosyn dosing.  Plan:  Zosyn 3.375 gr IV q8h extended interval infusion  Vancomycin 1000 mg IV x1, then vancomycin 750 mg IV q8h   Follow clinical course, cultures, renal function as available   Height: 6\' 2"  (188 cm) Weight: 150 lb (68 kg) IBW/kg (Calculated) : 82.2  Temp (24hrs), Avg:102.3 F (39.1 C), Min:102.3 F (39.1 C), Max:102.3 F (39.1 C)   Recent Labs Lab 06/23/16 0545 06/23/16 0551  WBC 10.9*  --   CREATININE 1.13  --   LATICACIDVEN  --  2.52*    Estimated Creatinine Clearance: 96.1 mL/min (by C-G formula based on SCr of 1.13 mg/dL).    No Known Allergies  Antimicrobials this admission: vancomycin 12/27 >>  Zosyn 12/27 >>   Dose adjustments this admission: ---  Microbiology results: 12/27 BCx: sent 12/27 UCx: sent    Thank you for allowing pharmacy to be a part of this patient's care.  Adalberto ColeNikola Taytem Ghattas, PharmD, BCPS Pager [phone] 06/23/2016 7:36 AM

## 2016-06-23 NOTE — ED Provider Notes (Signed)
[facility]-EMERGENCY DEPT Provider Note   CSN: [id number] Arrival date & time: 06/23/16  [id number]     History   Chief Complaint Chief Complaint  Patient presents with  . Drug Overdose    HPI Chad James is a 25 y.o. male   Patient is a 25 year old male with history of heroin overdose, depression, asthma presenting for drug overdose earlier this morning. Patient reports that this is not the first time. Patient was last year in July for same complaint. Patient reports going to rehabilitation one year ago and using since then. Patient reports smoking a 9 year pack history of smoking. Patient states that he's had chest pain secondary to a cough he's had for a week and half. Patient states that his symptoms have gradually improved. Patient denies shortness of breath, urinary symptoms, changes in bowel movements.  Per EMS patient found at home by his mother unconscious with needle in his right arm. Mom gave him 4 mg Narcan IN   The history is provided by the patient, medical records and the EMS personnel. The history is limited by the condition of the patient.  Drug Overdose  Pertinent negatives include no chest pain, no abdominal pain and no shortness of breath.    Past Medical History:  Diagnosis Date  . Asthma   . Depression   . Heroin abuse     Patient Active Problem List   Diagnosis Date Noted  . Sepsis (HCC) 06/23/2016  . Aspiration pneumonia (HCC) 06/23/2016  . Acute encephalopathy 06/23/2016  . IVDU (intravenous drug user) 06/23/2016    History reviewed. No pertinent surgical history.     Home Medications    Prior to Admission medications   Not on File    Family History No family history on file.  Social History Social History  Substance Use Topics  . Smoking status: Current Every Day Smoker    Packs/day: 1.00    Types: Cigarettes  . Smokeless tobacco: Never Used  . Alcohol use Yes     Comment: occ     Allergies   Patient has no known  allergies.   Review of Systems Review of Systems  Constitutional: Positive for chills.  HENT: Negative for congestion and trouble swallowing.   Respiratory: Positive for chest tightness (He says secondary to his coughing for the last week and a half.). Negative for shortness of breath.   Cardiovascular: Negative for chest pain.  Gastrointestinal: Negative for abdominal pain and nausea.  Genitourinary: Negative for difficulty urinating.  Musculoskeletal: Negative for neck pain and neck stiffness.  Skin: Positive for wound (Right antecubital fossa).  Neurological: Positive for weakness.  Psychiatric/Behavioral: Negative for agitation.     Physical Exam Updated Vital Signs BP (!) 87/52   Pulse 98   Temp 99.2 F (37.3 C) (Oral)   Resp 20   Ht 6\' 2"  (1.88 m)   Wt 68 kg   SpO2 95%   BMI 19.26 kg/m   Physical Exam  Constitutional: He is oriented to person, place, and time. He appears well-developed and well-nourished.  HENT:  Head: Normocephalic and atraumatic.  Nose: Nose normal.  Mouth/Throat: Oropharynx is clear and moist.  Eyes: Conjunctivae and EOM are normal. Pupils are equal, round, and reactive to light.  Neck: Normal range of motion. Neck supple.  Cardiovascular: Normal heart sounds.  Tachycardia present.   No obvious murmur.  Pulmonary/Chest: Effort normal and breath sounds normal. No accessory muscle usage. No respiratory distress. He exhibits no tenderness.  No increased  work of breathing. Maintaining an airway without difficulty. Handling oral secretions without difficulty  Abdominal: Soft. Bowel sounds are normal. There is no tenderness. There is no rebound and no guarding.  Musculoskeletal: Normal range of motion.  Neurological: He is alert and oriented to person, place, and time.  Somnolent but arousable.   Skin: Skin is warm. Capillary refill takes less than 2 seconds.  Right antecubital fossa has marks of trauma from previous injections. No abscess, slightly  erythematous, but no significant erythema surrounding marks.  Psychiatric: He has a normal mood and affect. His behavior is normal.  Nursing note and vitals reviewed.    ED Treatments / Results  Labs (all labs ordered are listed, but only abnormal results are displayed) Labs Reviewed  COMPREHENSIVE METABOLIC PANEL - Abnormal; Notable for the following:       Result Value   Glucose, Bld 142 (*)    BUN 23 (*)    AST 112 (*)    ALT 153 (*)    All other components within normal limits  CBC WITH DIFFERENTIAL/PLATELET - Abnormal; Notable for the following:    WBC 10.9 (*)    RBC 3.94 (*)    Hemoglobin 12.3 (*)    HCT 36.7 (*)    Neutro Abs 10.0 (*)    Lymphs Abs 0.6 (*)    All other components within normal limits  ACETAMINOPHEN LEVEL - Abnormal; Notable for the following:    Acetaminophen (Tylenol), Serum <10 (*)    All other components within normal limits  I-STAT CG4 LACTIC ACID, ED - Abnormal; Notable for the following:    Lactic Acid, Venous 2.52 (*)    All other components within normal limits  CULTURE, BLOOD (ROUTINE X 2)  CULTURE, BLOOD (ROUTINE X 2)  URINE CULTURE  ETHANOL  SALICYLATE LEVEL  RAPID URINE DRUG SCREEN, HOSP PERFORMED  URINALYSIS, ROUTINE W REFLEX MICROSCOPIC  I-STAT CG4 LACTIC ACID, ED    EKG  EKG Interpretation  Date/Time:  Wednesday June 23 2016 05:18:19 EST Ventricular Rate:  123 PR Interval:    QRS Duration: 81 QT Interval:  310 QTC Calculation: 444 R Axis:   98 Text Interpretation:  Sinus tachycardia Borderline right axis deviation Confirmed by DELO  MD, DOUGLAS ([id number]) on 06/23/2016 7:51:48 AM       Radiology Dg Chest 2 View  Result Date: 06/23/2016 CLINICAL DATA:  Drug overdose.  Fever. EXAM: CHEST  2 VIEW COMPARISON:  05/31/2014 FINDINGS: The cardiomediastinal contours are normal. The lungs are clear. Pulmonary vasculature is normal. No consolidation, pleural effusion, or pneumothorax. No acute osseous abnormalities are seen.  IMPRESSION: No active cardiopulmonary disease. Electronically Signed   By: Rubye OaksMelanie  Ehinger M.D.   On: 06/23/2016 06:06    Procedures Procedures (including critical care time)  Medications Ordered in ED Medications  vancomycin (VANCOCIN) IVPB 750 mg/150 ml premix (not administered)  piperacillin-tazobactam (ZOSYN) IVPB 3.375 g (not administered)  acetaminophen (TYLENOL) tablet 325 mg (325 mg Oral Given 06/23/16 0640)  naloxone [facility](NARCAN) injection 0.4 mg (0.4 mg Intravenous Given 06/23/16 0640)  sodium chloride 0.9 % bolus 1,000 mL (0 mLs Intravenous Stopped 06/23/16 0702)    And  sodium chloride 0.9 % bolus 1,000 mL (0 mLs Intravenous Stopped 06/23/16 0702)    And  sodium chloride 0.9 % bolus 250 mL (0 mLs Intravenous Stopped 06/23/16 0806)  vancomycin (VANCOCIN) IVPB 1000 mg/200 mL premix (0 mg Intravenous Stopped 06/23/16 0840)  piperacillin-tazobactam (ZOSYN) IVPB 3.375 g (0 g Intravenous Stopped 06/23/16 0731)  Initial Impression / Assessment and Plan / ED Course  I have reviewed the triage vital signs and the nursing notes.  Pertinent labs & imaging results that were available during my care of the patient were reviewed by me and considered in my medical decision making (see chart for details).  Clinical Course   Patient is a 25 year old male with history of heroin overdose presents for heroin overdose. Patient has seen in July for same complaint. On exam patient is somnolent but arousable. Patient is hypotensive, tachycardic, and febrile at 102.3. Heart sounds clear with no obvious murmur. Lung sounds clear. Right arm shows marks of his injection site. There is no abscess or surrounding erythema showing obvious infection at site. Patient treated accordingly for an overdose in ED. Due to presentation vital signs patient is started on sepsis protocol. Lactic acid at 2.52. WBC slightly increased to 10.9.  Patient also seen by Dr. Judd Lien.   I spoke with Dr. Isaiah Serge at critical care.  He assessed patient and thought if repeat lactic acid improved then he would be better off at stepdown due to the new normalization of blood pressure and improvement of Lactic acid.   Repeat Lactic acid improved to 0.64.  Dr. Irene Limbo agreed to have patient admitted for further evaluation.   Final Clinical Impressions(s) / ED Diagnoses   Final diagnoses:  Accidental overdose of heroin, initial encounter    New Prescriptions New Prescriptions   No medications on file     [address] [place], [place] 06/23/16 1108    Geoffery Lyons, MD 06/24/16 [phone]

## 2016-06-23 NOTE — ED Notes (Signed)
Bed: RESA Expected date:  Expected time:  Means of arrival:  Comments: 25 yo M  Overdose

## 2016-06-23 NOTE — ED Notes (Signed)
Bed: WA16 Expected date:  Expected time:  Means of arrival:  Comments: No bed  

## 2016-06-23 NOTE — ED Notes (Signed)
Mom phone [phone] (Buffy).

## 2016-06-24 ENCOUNTER — Inpatient Hospital Stay (HOSPITAL_COMMUNITY): Payer: Self-pay

## 2016-06-24 DIAGNOSIS — A419 Sepsis, unspecified organism: Secondary | ICD-10-CM | POA: Diagnosis not present

## 2016-06-24 DIAGNOSIS — F329 Major depressive disorder, single episode, unspecified: Secondary | ICD-10-CM

## 2016-06-24 DIAGNOSIS — J690 Pneumonitis due to inhalation of food and vomit: Secondary | ICD-10-CM

## 2016-06-24 DIAGNOSIS — T401X1A Poisoning by heroin, accidental (unintentional), initial encounter: Secondary | ICD-10-CM | POA: Diagnosis not present

## 2016-06-24 DIAGNOSIS — T50904A Poisoning by unspecified drugs, medicaments and biological substances, undetermined, initial encounter: Secondary | ICD-10-CM

## 2016-06-24 DIAGNOSIS — G9340 Encephalopathy, unspecified: Secondary | ICD-10-CM | POA: Diagnosis not present

## 2016-06-24 DIAGNOSIS — F1990 Other psychoactive substance use, unspecified, uncomplicated: Secondary | ICD-10-CM

## 2016-06-24 LAB — HIV ANTIBODY (ROUTINE TESTING W REFLEX): HIV Screen 4th Generation wRfx: NONREACTIVE

## 2016-06-24 LAB — URINE CULTURE: Culture: NO GROWTH

## 2016-06-24 LAB — BASIC METABOLIC PANEL
ANION GAP: 4 — AB (ref 5–15)
BUN: 13 mg/dL (ref 6–20)
CHLORIDE: 108 mmol/L (ref 101–111)
CO2: 29 mmol/L (ref 22–32)
Calcium: 8.2 mg/dL — ABNORMAL LOW (ref 8.9–10.3)
Creatinine, Ser: 0.86 mg/dL (ref 0.61–1.24)
GFR calc non Af Amer: >60 mL/min (ref >60)
Glucose, Bld: 123 mg/dL — ABNORMAL HIGH (ref 65–99)
Potassium: 3.5 mmol/L (ref 3.5–5.1)
Sodium: 141 mmol/L (ref 135–145)

## 2016-06-24 LAB — STREP PNEUMONIAE URINARY ANTIGEN: STREP PNEUMO URINARY ANTIGEN: NEGATIVE

## 2016-06-24 LAB — CBC
HCT: 33.7 % — ABNORMAL LOW (ref 39.0–52.0)
HEMOGLOBIN: 11.4 g/dL — AB (ref 13.0–17.0)
MCH: 31.5 pg (ref 26.0–34.0)
MCHC: 33.8 g/dL (ref 30.0–36.0)
MCV: 93.1 fL (ref 78.0–100.0)
Platelets: 112 10*3/uL — ABNORMAL LOW (ref 150–400)
RBC: 3.62 MIL/uL — AB (ref 4.22–5.81)
RDW: 13.9 % (ref 11.5–15.5)
WBC: 4.2 10*3/uL (ref 4.0–10.5)

## 2016-06-24 LAB — APTT: aPTT: 32 seconds (ref 24–36)

## 2016-06-24 LAB — PROTIME-INR
INR: 1
Prothrombin Time: 13.2 seconds (ref 11.4–15.2)

## 2016-06-24 MED ORDER — AMOXICILLIN-POT CLAVULANATE 875-125 MG PO TABS
1.0000 | ORAL_TABLET | Freq: Two times a day (BID) | ORAL | Status: DC
  Administered 2016-06-24 – 2016-06-25 (×3): 1 via ORAL
  Filled 2016-06-24 (×3): qty 1

## 2016-06-24 NOTE — Progress Notes (Signed)
[facility] PROGRESS NOTE  Chad [name]:[id number] DOB: May 17, 1991 DOA: 06/23/2016 PCP: Herb GraysSPEAR, TAMMY, MD (Inactive)  Interim summary and HPI 25 year old man PMH of depression and drug abuse just released from "detox" presented to the hospital after being found unresponsive at home with a syringe. In the emergency department afebrile, hypotensive, elevated lactic acid. Admitted under sepsis protocol for concerns on PNA (most likely aspiration) and SI.  Assessment/Plan: 1-sepsis: from aspiration PNA -sepsis features resolved now -patient hemodynamically stable and non toxic on exam -will transition broad spectrum antibiotics to augmentin and observe response  -continue supportive care -follow final culture results  2-drug overdose/concerns for Suicidal attempt  -psychiatry consulted -patient denies SI or hallucinations currently  -UDS positive for opiates and patient with hx of heroin abuse -will benefit of prolonged detox program and further outpatient support  -also might benefit of treatment of underlying depression; will follow psych rec's  3-depression -has not been on any medication regimen recently  -psych consulted -no SI; but feeling hopeless, lonely and depressed   4-Marijuana use -cessation counseling provided   Code Status: Full Family Communication: no family at bedside  Disposition Plan: patient is hemodynamically stable and non toxic currently; repeat CXR with suggestive findings for PNA. Waiting psych evaluation. Will transition abx's to PO   Consultants:  Psychiatry   Procedures:  See below for x-ray reports   Antibiotics:  Vancomycin and Zosyn 12/27>>>12/28  Augmentin 12/28  HPI/Subjective: Afebrile, no CP and denies SOB. Currently w/o SI. Feeling depressed and hopeless   Objective: Vitals:   06/24/16 0529 06/24/16 1419  BP: 109/66 122/73  Pulse: 80 81  Resp: 18 16  Temp: 98.6 F (37 C) 98.8 F (37.1 C)    Intake/Output  Summary (Last 24 hours) at 06/24/16 1758 Last data filed at 06/24/16 1700  Gross per 24 hour  Intake             2765 ml  Output              600 ml  Net             2165 ml   Filed Weights   06/23/16 0521 06/23/16 2220  Weight: 68 kg (150 lb) 66 kg (145 lb 8 oz)    Exam:   General:  Afebrile, calm and in no distress. Patient denies SI at this moment, but expressed depression feeling and hopelessness. No CP and no SOB.   Cardiovascular: S1 and S2, no rubs, no gallops  Respiratory: scattered rhonchi, no wheezing, good air movement   Abdomen:soft, NT, ND, positive BS  Musculoskeletal: no edema, no cyanosis   Data Reviewed: Basic Metabolic Panel:  Recent Labs Lab 06/23/16 0545 06/24/16 0129  NA 139 141  K 4.1 3.5  CL 101 108  CO2 28 29  GLUCOSE 142* 123*  BUN 23* 13  CREATININE 1.13 0.86  CALCIUM 9.0 8.2*   Liver Function Tests:  Recent Labs Lab 06/23/16 0545  AST 112*  ALT 153*  ALKPHOS 96  BILITOT 0.5  PROT 6.5  ALBUMIN 4.1   CBC:  Recent Labs Lab 06/23/16 0545 06/24/16 0129  WBC 10.9* 4.2  NEUTROABS 10.0*  --   HGB 12.3* 11.4*  HCT 36.7* 33.7*  MCV 93.1 93.1  PLT 166 112*   CBG: No results for input(s): GLUCAP in the last 168 hours.  Recent Results (from the past 240 hour(s))  Culture, blood (routine x 2)     Status: None (Preliminary result)  Collection Time: 06/23/16  5:45 AM  Result Value Ref Range Status   Specimen Description BLOOD LEFT WRIST  Final   Special Requests BOTTLES DRAWN AEROBIC AND ANAEROBIC 5ML  Final   Culture   Final    NO GROWTH 1 DAY [facility]    Report Status PENDING  Incomplete  Culture, blood (routine x 2)     Status: None (Preliminary result)   Collection Time: 06/23/16  6:26 AM  Result Value Ref Range Status   Specimen Description BLOOD RIGHT WRIST  Final   Special Requests IN PEDIATRIC BOTTLE 3ML  Final   Culture   Final    NO GROWTH 1 DAY [facility]formed [facility]     Report Status PENDING  Incomplete  Urine culture     Status: None   Collection Time: 06/23/16 12:32 PM  Result Value Ref Range Status   Specimen Description URINE, CLEAN CATCH  Final   Special Requests NONE  Final   Culture NO GROWTH [facility]   Final   Report Status 06/24/2016 FINAL  Final     Studies: Dg Chest 2 View  Result Date: 06/24/2016 CLINICAL DATA:  Shortness of breath and cough EXAM: CHEST  2 VIEW COMPARISON:  June 23, 2016 FINDINGS: There is focal consolidation in the posterolateral right base with minimal right pleural effusion. Lungs elsewhere clear. Heart size and pulmonary vascularity are normal. No adenopathy. No bone lesions. IMPRESSION: New opacity posterolateral right base consistent with early pneumonia. Minimal right pleural effusion. Lungs elsewhere clear. Stable cardiac silhouette. Electronically Signed   By: Bretta BangWilliam  Woodruff III M.D.   On: 06/24/2016 09:14   Dg Chest 2 View  Result Date: 06/23/2016 CLINICAL DATA:  Drug overdose.  Fever. EXAM: CHEST  2 VIEW COMPARISON:  05/31/2014 FINDINGS: The cardiomediastinal contours are normal. The lungs are clear. Pulmonary vasculature is normal. No consolidation, pleural effusion, or pneumothorax. No acute osseous abnormalities are seen. IMPRESSION: No active cardiopulmonary disease. Electronically Signed   By: Rubye OaksMelanie  Ehinger M.D.   On: 06/23/2016 06:06    Scheduled Meds: . amoxicillin-clavulanate  1 tablet Oral Q12H  . sodium chloride flush  3 mL Intravenous Q12H   Continuous Infusions: . sodium chloride Stopped (06/24/16 1430)    Principal Problem:   Sepsis (HCC) Active Problems:   Aspiration pneumonia (HCC)   Acute encephalopathy   IVDU (intravenous drug user)    Time spent: 25 minutes    Vassie LollMadera, Jaylia Pettus  [facility] Pager [phone]. If 7PM-7AM, please contact night-coverage at [email or web address], password [email or web address] 06/24/2016, 5:58 PM  LOS: 1 day

## 2016-06-25 DIAGNOSIS — J690 Pneumonitis due to inhalation of food and vomit: Secondary | ICD-10-CM | POA: Diagnosis not present

## 2016-06-25 DIAGNOSIS — F1990 Other psychoactive substance use, unspecified, uncomplicated: Secondary | ICD-10-CM | POA: Diagnosis not present

## 2016-06-25 DIAGNOSIS — G9340 Encephalopathy, unspecified: Secondary | ICD-10-CM | POA: Diagnosis not present

## 2016-06-25 DIAGNOSIS — F17210 Nicotine dependence, cigarettes, uncomplicated: Secondary | ICD-10-CM

## 2016-06-25 DIAGNOSIS — A419 Sepsis, unspecified organism: Secondary | ICD-10-CM | POA: Diagnosis not present

## 2016-06-25 DIAGNOSIS — T401X1A Poisoning by heroin, accidental (unintentional), initial encounter: Secondary | ICD-10-CM | POA: Diagnosis not present

## 2016-06-25 DIAGNOSIS — Z79899 Other long term (current) drug therapy: Secondary | ICD-10-CM

## 2016-06-25 LAB — CBC
HEMATOCRIT: 35.7 % — AB (ref 39.0–52.0)
Hemoglobin: 12.1 g/dL — ABNORMAL LOW (ref 13.0–17.0)
MCH: 31.5 pg (ref 26.0–34.0)
MCHC: 33.9 g/dL (ref 30.0–36.0)
MCV: 93 fL (ref 78.0–100.0)
PLATELETS: 132 10*3/uL — AB (ref 150–400)
RBC: 3.84 MIL/uL — ABNORMAL LOW (ref 4.22–5.81)
RDW: 13.6 % (ref 11.5–15.5)
WBC: 4.3 10*3/uL (ref 4.0–10.5)

## 2016-06-25 LAB — BASIC METABOLIC PANEL
Anion gap: 5 (ref 5–15)
BUN: 10 mg/dL (ref 6–20)
CALCIUM: 9.1 mg/dL (ref 8.9–10.3)
CO2: 32 mmol/L (ref 22–32)
CREATININE: 0.85 mg/dL (ref 0.61–1.24)
Chloride: 107 mmol/L (ref 101–111)
GFR calc Af Amer: >60 mL/min (ref >60)
GLUCOSE: 112 mg/dL — AB (ref 65–99)
Potassium: 4 mmol/L (ref 3.5–5.1)
Sodium: 144 mmol/L (ref 135–145)

## 2016-06-25 MED ORDER — ACETAMINOPHEN 325 MG PO TABS: 650.0000 mg | ORAL_TABLET | Freq: Four times a day (QID) | ORAL | 0 refills | Status: DC | PRN

## 2016-06-25 MED ORDER — AMOXICILLIN-POT CLAVULANATE 875-125 MG PO TABS: 1.0000 | ORAL_TABLET | Freq: Two times a day (BID) | ORAL | 0 refills | Status: DC

## 2016-06-25 NOTE — Clinical Social Work Psych Note (Signed)
Clinical Social Worker Psych Service Line Progress Note  Clinical Social Worker: Lia Hopping, LCSW Date/Time: 06/25/2016, 3:02 PM   Review of Patient  Overall Medical Condition:  Medically stable/ Psych cleared.   Participation Level:  Active Participation Quality: Appropriate Other Participation Quality: Calm, Cooperative, Pleasant.   Affect: Appropriate Cognitive: Appropriate Reaction to Medications/Concerns: No reactions to medications indicated.   Modes of Intervention: Support, Solution-focused   Summary of Progress/Plan at Discharge  Summary of Progress/Plan at Discharge: LCSWA met with patient at bedside and explained reason for consult-SA resources. Patient alert and oriented x4 and agreeable to the assessment. Patient reports he has been using heroin on and off since he was 21. Patient reports using cocaine and marijuana. Patient denies mental health history and SI/HI.  The patient reports he recently completed a thirty day detox program at [facility] in [place]. The patient reports he then went to [facility] but did not stay because he did not feel safe there. The patient reports he lives at home with his mother. He reports family dynamics and feels his mother is not very supportive. The patient reports after a month of sobriety he relapsed after getting into argument with his mother.  The patient reports most of his support is from his boss at current job, who is a role model to him. The patient reports he has been looking into [facility], a place where he can be more accountable.  Patient reports he is on a waiting list for [facility] one year program.  LCSWA provided patient with list of SA list.  Patient was receptive to outpatient resources and list of [facility] in [place].

## 2016-06-25 NOTE — Discharge Summary (Signed)
Physician Discharge Summary  Tiney RougeWalter D Angert NWG:[id number]:[id number] DOB: [birth year] DOA: 06/23/2016  PCP: Herb GraysSPEAR, TAMMY, MD (Inactive)  Admit date: 06/23/2016 Discharge date: 06/25/2016  Time spent: 35 minutes  Recommendations for Outpatient Follow-up:  1. Repeat CXR 2 views in 4-6 weeks to follow resolution of infiltrates 2. Provide assistance and support with ongoing substance abuse problem   Discharge Diagnoses:  Principal Problem:   Sepsis (HCC) Active Problems:   Aspiration pneumonia (HCC)   Acute encephalopathy   IVDU (intravenous drug user)   Accidental overdose of heroin hx of depression  Hx of asthma   Discharge Condition: stable and improved. Discharge home with instructions to follow up with PCP in 10 days.  Diet recommendation: regular diet   Filed Weights   06/23/16 0521 06/23/16 2220  Weight: 68 kg (150 lb) 66 kg (145 lb 8 oz)    History of present illness:  25 year old man PMH of depression and drug abuse just released from "detox" presented to the hospital after being found unresponsive at home with a syringe. In the emergency department afebrile, hypotensive, elevated lactic acid. Admitted under sepsis protocol for concerns on PNA (most likely aspiration) and SI.  Hospital Course:  1-sepsis: from aspiration PNA -sepsis features resolved at discharge -patient hemodynamically stable and non toxic  -will discharge on augmentin to complete antibiotic therapy -follow final culture results  2-drug overdose/concerns for Suicidal attempt  -psychiatry consulted -patient denies SI or hallucinations currently  -after evaluation by psychiatry is felt his overdose was accidental -UDS positive for opiates and patient with hx of heroin abuse -will benefit of prolonged detox program and further outpatient support  -also might benefit of further outpatient psychiatry/psychoterapy evaluation  3-Depression -has not been on any medication regimen recently  -psych consulted,  found in not need for inpatient treatment currently and per their assessment in not acute depression to start treatment. -they recommended outpatient follow up and [facility] -no SI or hallucinations; safe to others and clear by psychiatry for discharge   4-Marijuana use -cessation counseling provided   Procedures:  See below for x-ray reports   Consultations:  Psychiatry   Discharge Exam: Vitals:   06/24/16 2114 06/25/16 0505  BP: 117/62 99/68  Pulse: 62 (!) 55  Resp: 18 16  Temp: 98.7 F (37.1 C) 98.6 F (37 C)     General:  Afebrile, calm and in no distress. Patient denies SI at this moment, but expressed intermittent burst of depression. No CP and no SOB.   Cardiovascular: S1 and S2, no rubs, no gallops  Respiratory: scattered rhonchi, no wheezing, good air movement   Abdomen: soft, NT, ND, positive BS  Musculoskeletal: no edema, no cyanosis.   Discharge Instructions   Discharge Instructions    Discharge instructions    Complete by:  As directed    Arrange follow up with PCP in 10 days Take medications as prescribed  Keep yourself well hydrated  Stop the use of recreational drugs Follow up plans to enroll in outpatient detox program and search to find and sponsor     Current Discharge Medication List    START taking these medications   Details  acetaminophen (TYLENOL) 325 MG tablet Take 2 tablets (650 mg total) by mouth every 6 (six) hours as needed for mild pain or headache (or Fever >/= 101). Qty: 30 tablet, Refills: 0    amoxicillin-clavulanate (AUGMENTIN) 875-125 MG tablet Take 1 tablet by mouth every 12 (twelve) hours. Qty: 12 tablet, Refills: 0  No Known Allergies Follow-up Information    SPEAR, TAMMY, MD. Schedule an appointment as soon as possible for a visit in 10 day(s).   Specialty:  Family Medicine          The results of significant diagnostics from this hospitalization (including imaging, microbiology,  ancillary and laboratory) are listed below for reference.    Significant Diagnostic Studies: Dg Chest 2 View  Result Date: 06/24/2016 CLINICAL DATA:  Shortness of breath and cough EXAM: CHEST  2 VIEW COMPARISON:  June 23, 2016 FINDINGS: There is focal consolidation in the posterolateral right base with minimal right pleural effusion. Lungs elsewhere clear. Heart size and pulmonary vascularity are normal. No adenopathy. No bone lesions. IMPRESSION: New opacity posterolateral right base consistent with early pneumonia. Minimal right pleural effusion. Lungs elsewhere clear. Stable cardiac silhouette. Electronically Signed   By: Bretta BangWilliam  Woodruff III M.D.   On: 06/24/2016 09:14   Dg Chest 2 View  Result Date: 06/23/2016 CLINICAL DATA:  Drug overdose.  Fever. EXAM: CHEST  2 VIEW COMPARISON:  05/31/2014 FINDINGS: The cardiomediastinal contours are normal. The lungs are clear. Pulmonary vasculature is normal. No consolidation, pleural effusion, or pneumothorax. No acute osseous abnormalities are seen. IMPRESSION: No active cardiopulmonary disease. Electronically Signed   By: Rubye OaksMelanie  Ehinger M.D.   On: 06/23/2016 06:06    Microbiology: Recent Results (from the past 240 hour(s))  Culture, blood (routine x 2)     Status: None (Preliminary result)   Collection Time: 06/23/16  5:45 AM  Result Value Ref Range Status   Specimen Description BLOOD LEFT WRIST  Final   Special Requests BOTTLES DRAWN AEROBIC AND ANAEROBIC 5ML  Final   Culture   Final    NO GROWTH 1 DAY Performed at [facility]    Report Status PENDING  Incomplete  Culture, blood (routine x 2)     Status: None (Preliminary result)   Collection Time: 06/23/16  6:26 AM  Result Value Ref Range Status   Specimen Description BLOOD RIGHT WRIST  Final   Special Requests IN PEDIATRIC BOTTLE 3ML  Final   Culture   Final    NO GROWTH 1 DAY Performed at [facility]    Report Status PENDING  Incomplete  Urine culture      Status: None   Collection Time: 06/23/16 12:32 PM  Result Value Ref Range Status   Specimen Description URINE, CLEAN CATCH  Final   Special Requests NONE  Final   Culture NO GROWTH Performed at [facility]    Final   Report Status 06/24/2016 FINAL  Final     Labs: Basic Metabolic Panel:  Recent Labs Lab 06/23/16 0545 06/24/16 0129 06/25/16 0442  NA 139 141 144  K 4.1 3.5 4.0  CL 101 108 107  CO2 28 29 32  GLUCOSE 142* 123* 112*  BUN 23* 13 10  CREATININE 1.13 0.86 0.85  CALCIUM 9.0 8.2* 9.1   Liver Function Tests:  Recent Labs Lab 06/23/16 0545  AST 112*  ALT 153*  ALKPHOS 96  BILITOT 0.5  PROT 6.5  ALBUMIN 4.1   CBC:  Recent Labs Lab 06/23/16 0545 06/24/16 0129 06/25/16 0442  WBC 10.9* 4.2 4.3  NEUTROABS 10.0*  --   --   HGB 12.3* 11.4* 12.1*  HCT 36.7* 33.7* 35.7*  MCV 93.1 93.1 93.0  PLT 166 112* 132*    Signed:  Vassie LollMadera, Keishon Chavarin MD.  [facility] 06/25/2016, 2:12 PM

## 2016-06-25 NOTE — Consult Note (Signed)
[place] Psychiatry Consult   Reason for Consult:  Heroin overdose Referring Physician:  Dr. Dyann Kief Patient Identification: Chad James MRN:  [id number] Principal Diagnosis: Sepsis [facility]) Diagnosis:   Patient Active Problem List   Diagnosis Date Noted  . Sepsis ([place]) [A41.9] 06/23/2016  . Aspiration pneumonia ([place]) [J69.0] 06/23/2016  . Acute encephalopathy [G93.40] 06/23/2016  . IVDU (intravenous drug user) [F19.90] 06/23/2016    Total Time spent with patient: 30 minutes  Subjective:   Chad James is a 25 y.o. male patient admitted with Heroin overdose.  HPI:  25 year old man PMH of depression and drug abuse just released from "detox" presented to the hospital after being found unresponsive at home with a syringe. In the emergency department afebrile, hypotensive, elevated lactic acid. Admitted under sepsis protocol for concerns on PNA (most likely aspiration) and SI.  Patient states that he has been using heroin since age 89 and used heavily for about 2 years. Last year he went through a five-month rehabilitation program run through church. After that he stayed sober but relapsed on cocaine about a month ago. He states that he works for a "junk removal" firm and has an excellent job with great coworkers who don't use drugs. He was doing well staying with a friend but his mother asked him to come home and lived for a while. He states that they don't get along and they had an argument and he chose to use heroin in order to deal with the hurt feelings he sustained during the argument. Later that night his mother found him unconscious. Urine drug screen was positive for opiates and tetrahydrocannabinol.  The patient denies any thoughts of self-harm or attempts to commit suicide. He states that he just wanted to get high "to get back at my mom.". He is very pleasant and polite today and states that most of the time he is not depressed. He has seen a psychiatrist once about 5 years ago  and was diagnosed with ADHD and anxiety and placed on Adderall and clonazepam. Neither of these would be indicated now since they are both addictive substances. The patient has been attending NA meetings and is about to find a sponsor. He is also interested in a halfway house program such as [facility]. He denies thoughts of suicide or self-harm or harm to others or auditory or visual hallucinations or paranoia. He is very pleasant and upbeat    Past Psychiatric History: Saw a psychiatrist a few times a couple of years ago. He has not had any prior psychiatric inpatient treatment  Risk to Self: Is patient at risk for suicide?: No Risk to Others:   Prior Inpatient Therapy:   Prior Outpatient Therapy:    Past Medical History:  Past Medical History:  Diagnosis Date  . Asthma   . Depression   . Heroin abuse    History reviewed. No pertinent surgical history. Family History: No family history on file. Family Psychiatric  History: none Social History:  History  Alcohol Use  . Yes    Comment: occ     History  Drug Use  . Types: Marijuana, Cocaine    Social History   Social History  . Marital status: Single    Spouse name: N/A  . Number of children: N/A  . Years of education: N/A   Social History Main Topics  . Smoking status: Current Every Day Smoker    Packs/day: 1.00    Types: Cigarettes  . Smokeless tobacco: Never Used  .  Alcohol use Yes     Comment: occ  . Drug use:     Types: Marijuana, Cocaine  . Sexual activity: Not Asked     Comment: no drug use since December   Other Topics Concern  . None   Social History Narrative  . None   Additional Social History:    Allergies:  No Known Allergies  Labs:  Results for orders placed or performed during the hospital encounter of 06/23/16 (from the past 48 hour(s))  Strep pneumoniae urinary antigen     Status: None   Collection Time: 06/23/16 11:34 PM  Result Value Ref Range   Strep Pneumo Urinary Antigen  NEGATIVE NEGATIVE    Comment:        Infection due to S. pneumoniae cannot be absolutely ruled out since the antigen present may be below the detection limit of the test. Performed at [facility]   HIV antibody     Status: None   Collection Time: 06/24/16  1:29 AM  Result Value Ref Range   HIV Screen 4th Generation wRfx Non Reactive Non Reactive    Comment: (NOTE) Performed At: [facility] [place], [place] [id number] Lindon Romp MD FH:[id number]   Protime-INR     Status: None   Collection Time: 06/24/16  1:29 AM  Result Value Ref Range   Prothrombin Time 13.2 11.4 - 15.2 seconds   INR 1.00   APTT     Status: None   Collection Time: 06/24/16  1:29 AM  Result Value Ref Range   aPTT 32 24 - 36 seconds  Basic metabolic panel     Status: Abnormal   Collection Time: 06/24/16  1:29 AM  Result Value Ref Range   Sodium 141 135 - 145 mmol/L   Potassium 3.5 3.5 - 5.1 mmol/L   Chloride 108 101 - 111 mmol/L   CO2 29 22 - 32 mmol/L   Glucose, Bld 123 (H) 65 - 99 mg/dL   BUN 13 6 - 20 mg/dL   Creatinine, Ser 0.86 0.61 - 1.24 mg/dL   Calcium 8.2 (L) 8.9 - 10.3 mg/dL   GFR calc non Af Amer >60 >60 mL/min   GFR calc Af Amer >60 >60 mL/min    Comment: (NOTE) The eGFR has been calculated using the CKD EPI equation. This calculation has not been validated in all clinical situations. eGFR's persistently <60 mL/min signify possible Chronic Kidney Disease.    Anion gap 4 (L) 5 - 15  CBC     Status: Abnormal   Collection Time: 06/24/16  1:29 AM  Result Value Ref Range   WBC 4.2 4.0 - 10.5 K/uL   RBC 3.62 (L) 4.22 - 5.81 MIL/uL   Hemoglobin 11.4 (L) 13.0 - 17.0 g/dL   HCT 33.7 (L) 39.0 - 52.0 %   MCV 93.1 78.0 - 100.0 fL   MCH 31.5 26.0 - 34.0 pg   MCHC 33.8 30.0 - 36.0 g/dL   RDW 13.9 11.5 - 15.5 %   Platelets 112 (L) 150 - 400 K/uL    Comment: SPECIMEN CHECKED FOR CLOTS REPEATED TO VERIFY PLATELET COUNT CONFIRMED BY SMEAR   CBC     Status:  Abnormal   Collection Time: 06/25/16  4:42 AM  Result Value Ref Range   WBC 4.3 4.0 - 10.5 K/uL   RBC 3.84 (L) 4.22 - 5.81 MIL/uL   Hemoglobin 12.1 (L) 13.0 - 17.0 g/dL   HCT 35.7 (L) 39.0 - 52.0 %  MCV 93.0 78.0 - 100.0 fL   MCH 31.5 26.0 - 34.0 pg   MCHC 33.9 30.0 - 36.0 g/dL   RDW 13.6 11.5 - 15.5 %   Platelets 132 (L) 150 - 400 K/uL  Basic metabolic panel     Status: Abnormal   Collection Time: 06/25/16  4:42 AM  Result Value Ref Range   Sodium 144 135 - 145 mmol/L   Potassium 4.0 3.5 - 5.1 mmol/L   Chloride 107 101 - 111 mmol/L   CO2 32 22 - 32 mmol/L   Glucose, Bld 112 (H) 65 - 99 mg/dL   BUN 10 6 - 20 mg/dL   Creatinine, Ser 0.85 0.61 - 1.24 mg/dL   Calcium 9.1 8.9 - 10.3 mg/dL   GFR calc non Af Amer >60 >60 mL/min   GFR calc Af Amer >60 >60 mL/min    Comment: (NOTE) The eGFR has been calculated using the CKD EPI equation. This calculation has not been validated in all clinical situations. eGFR's persistently <60 mL/min signify possible Chronic Kidney Disease.    Anion gap 5 5 - 15    Current Facility-Administered Medications  Medication Dose Route Frequency Provider Last Rate Last Dose  . 0.9 %  sodium chloride infusion   Intravenous Continuous Samuella Cota, MD 150 mL/hr at 06/25/16 1204    . acetaminophen (TYLENOL) tablet 650 mg  650 mg Oral Q6H PRN Samuella Cota, MD       Or  . acetaminophen (TYLENOL) suppository 650 mg  650 mg Rectal Q6H PRN Samuella Cota, MD      . amoxicillin-clavulanate (AUGMENTIN) 875-125 MG per tablet 1 tablet  1 tablet Oral Q12H Barton Dubois, MD   1 tablet at 06/25/16 1203  . sodium chloride flush (NS) 0.9 % injection 3 mL  3 mL Intravenous Q12H Samuella Cota, MD   3 mL at 06/23/16 2321    Musculoskeletal: Strength & Muscle Tone: within normal limits Gait & Station: normal Patient leans: N/A  Psychiatric Specialty Exam: Physical Exam  Review of Systems  All other systems reviewed and are negative.   Blood  pressure 99/68, pulse (!) 55, temperature 98.6 F (37 C), temperature source Oral, resp. rate 16, height _0  (1.88 m), weight 66 kg (145 lb 8 oz), SpO2 100 %.Body mass index is 18.68 kg/m.  General Appearance: Casual and Fairly Groomed  Eye Contact:  Good  Speech:  Clear and Coherent  Volume:  Normal  Mood:  Euthymic  Affect:  Congruent  Thought Process:  Goal Directed  Orientation:  Full (Time, Place, and Person)  Thought Content:  Logical  Suicidal Thoughts:  No  Homicidal Thoughts:  No  Memory:  Immediate;   Good Recent;   Good Remote;   Fair  Judgement:  Poor  Insight:  Lacking  Psychomotor Activity:  Normal  Concentration:  Concentration: Fair and Attention Span: Fair  Recall:  Good  Fund of Knowledge:  Good  Language:  Good  Akathisia:  No  Handed:  Right  AIMS (if indicated):     Assets:  Communication Skills Desire for Improvement Physical Health Resilience Social Support  ADL's:  Intact  Cognition:  WNL  Sleep:        Treatment Plan Summary:   Disposition: No evidence of imminent risk to self or others at present.   Patient does not meet criteria for psychiatric inpatient admission. Supportive therapy provided about ongoing stressors.  Patient does not appear to be depressed  but would benefit from outpatient substance abuse resources. Please ask unit social worker to provide these. There is no evidence that he had attempted to commit suicide or needs further inpatient psychiatric treatment. From a psychiatric standpoint he is safe to return home with outpatient resources  Levonne Spiller, MD 06/25/2016 12:47 PM

## 2016-06-25 NOTE — Care Management Note (Signed)
Case Management Note  Patient Details  Name: Chad James MRN: [id number] Date of Birth: 03-Jun-1991  Subjective/Objective: 25 y/o m admitted w/Sepsis. From home. Psych cons-otpt SA resources.Psych sw-provided w/resources. No CM needs.                   Action/Plan:d/c home.   Expected Discharge Date:   (unknown)               Expected Discharge Plan:  Home/Self Care  In-House Referral:  Clinical Social Work  Discharge planning Services  CM Consult  Post Acute Care Choice:    Choice offered to:     DME Arranged:    DME Agency:     HH Arranged:    HH Agency:     Status of Service:  Completed, signed off  If discussed at [employer] Meetings, dates discussed:    Additional Comments:  Lanier ClamMahabir, Zyanne Schumm, RN 06/25/2016, 2:15 PM

## 2016-06-28 LAB — CULTURE, BLOOD (ROUTINE X 2)
CULTURE: NO GROWTH
CULTURE: NO GROWTH

## 2017-02-04 ENCOUNTER — Emergency Department (HOSPITAL_COMMUNITY)
Admission: EM | Admit: 2017-02-04 | Discharge: 2017-02-05 | Disposition: A | Discharge status: Home / Self Care | Payer: BLUE CROSS/BLUE SHIELD | Attending: Emergency Medicine | Admitting: Emergency Medicine

## 2017-02-04 ENCOUNTER — Encounter (HOSPITAL_COMMUNITY): Payer: Self-pay | Admitting: *Deleted

## 2017-02-04 DIAGNOSIS — Z79899 Other long term (current) drug therapy: Secondary | ICD-10-CM | POA: Insufficient documentation

## 2017-02-04 DIAGNOSIS — R531 Weakness: Secondary | ICD-10-CM | POA: Insufficient documentation

## 2017-02-04 DIAGNOSIS — Z022 Encounter for examination for admission to residential institution: Secondary | ICD-10-CM | POA: Insufficient documentation

## 2017-02-04 DIAGNOSIS — J45909 Unspecified asthma, uncomplicated: Secondary | ICD-10-CM | POA: Insufficient documentation

## 2017-02-04 DIAGNOSIS — Z733 Stress, not elsewhere classified: Secondary | ICD-10-CM | POA: Insufficient documentation

## 2017-02-04 DIAGNOSIS — F17210 Nicotine dependence, cigarettes, uncomplicated: Secondary | ICD-10-CM | POA: Insufficient documentation

## 2017-02-04 DIAGNOSIS — G5631 Lesion of radial nerve, right upper limb: Secondary | ICD-10-CM

## 2017-02-04 DIAGNOSIS — R45851 Suicidal ideations: Secondary | ICD-10-CM | POA: Insufficient documentation

## 2017-02-04 LAB — COMPREHENSIVE METABOLIC PANEL
ALT: 16 U/L — ABNORMAL LOW (ref 17–63)
AST: 19 U/L (ref 15–41)
Albumin: 3.7 g/dL (ref 3.5–5.0)
Alkaline Phosphatase: 84 U/L (ref 38–126)
Anion gap: 11 (ref 5–15)
BILIRUBIN TOTAL: 0.4 mg/dL (ref 0.3–1.2)
BUN: 12 mg/dL (ref 6–20)
CALCIUM: 9 mg/dL (ref 8.9–10.3)
CO2: 23 mmol/L (ref 22–32)
CREATININE: 0.87 mg/dL (ref 0.61–1.24)
Chloride: 107 mmol/L (ref 101–111)
GFR calc Af Amer: >60 mL/min (ref >60)
Glucose, Bld: 92 mg/dL (ref 65–99)
Potassium: 3.8 mmol/L (ref 3.5–5.1)
Sodium: 141 mmol/L (ref 135–145)
Total Protein: 6.5 g/dL (ref 6.5–8.1)

## 2017-02-04 LAB — RAPID URINE DRUG SCREEN, HOSP PERFORMED
Amphetamines: NOT DETECTED
Barbiturates: NOT DETECTED
Benzodiazepines: NOT DETECTED
COCAINE: NOT DETECTED
OPIATES: NOT DETECTED
Tetrahydrocannabinol: POSITIVE — AB

## 2017-02-04 LAB — CBC
HCT: 40 % (ref 39.0–52.0)
HEMOGLOBIN: 12.8 g/dL — AB (ref 13.0–17.0)
MCH: 28.6 pg (ref 26.0–34.0)
MCHC: 32 g/dL (ref 30.0–36.0)
MCV: 89.3 fL (ref 78.0–100.0)
PLATELETS: 218 10*3/uL (ref 150–400)
RBC: 4.48 MIL/uL (ref 4.22–5.81)
RDW: 14.6 % (ref 11.5–15.5)
WBC: 6.6 10*3/uL (ref 4.0–10.5)

## 2017-02-04 LAB — ACETAMINOPHEN LEVEL

## 2017-02-04 LAB — ETHANOL: Alcohol, Ethyl (B): <5 mg/dL (ref <5)

## 2017-02-04 LAB — SALICYLATE LEVEL

## 2017-02-04 MED ORDER — CLONIDINE HCL 0.2 MG PO TABS
0.1000 mg | ORAL_TABLET | Freq: Two times a day (BID) | ORAL | Status: DC | PRN
  Administered 2017-02-04: 0.1 mg via ORAL
  Filled 2017-02-04: qty 1

## 2017-02-04 MED ORDER — IBUPROFEN 400 MG PO TABS
400.0000 mg | ORAL_TABLET | Freq: Four times a day (QID) | ORAL | Status: DC | PRN
  Administered 2017-02-04: 400 mg via ORAL
  Filled 2017-02-04: qty 1

## 2017-02-04 MED ORDER — ONDANSETRON 4 MG PO TBDP
4.0000 mg | ORAL_TABLET | Freq: Four times a day (QID) | ORAL | Status: DC | PRN
  Administered 2017-02-04: 4 mg via ORAL
  Filled 2017-02-04: qty 1

## 2017-02-04 NOTE — BH Assessment (Addendum)
Tele Assessment Note   Chad James is an 26 y.o. male who presents to the ED voluntarily. Pt reports he is requesting to detox from heroin. Pt states he is an IV heroin user and last use was 02/03/17 at 13:00. Pt reports he is not suicidal and states his mom told him to "say the S word" because that would guarantee him a bed. Pt stated "I just want to apologize because I just went about it the wrong way. I'm not suicidal and I've never been suicidal. My mom told me to say the S word and they would let me stay." Pt reports a hx of substance abuse treatment and states his longest period of sobriety was 1 year and 5 months. Pt reports he was supposed to go to detox at [facility] this morning but he was unable to go because he does not have an ID. Pt reports his mother drove him to the [facility] in order to obtain an ID but they were told they would not have time to get to him today therefore he came to the hospital instead for detox. Pt reports he is on the wait list for [facility] to attend their 1 year SA treatment program. Pt states he has to call [facility] every 2 weeks to ensure he remains on the wait list. Pt reports he has also been to [facility] in the past but he does not feel they were helpful. Pt appears pleasant during the assessment. Pt was asked if he has experienced any changes in his sleeping and eating habits and he stated "well this bed is uncomfortable" and began to laugh. Pt states he "wants to be a good son and a good role model for his brothers." Pt reports he wants his family to be proud of him and feels that he is "ready to get off of drugs."   Case discussed with Nira Conn, NP who recommends AM psych eval. EDP Dr. Adriana Simas, MD notified of the recommendation.   Diagnosis: Opioid Use D/O; Cannabis Use D/O  Past Medical History:  Past Medical History:  Diagnosis Date  . Asthma   . Depression   . Heroin abuse     History reviewed. No pertinent surgical history.  Family History: No family history on  file.  Social History:  reports that he has been smoking Cigarettes.  He has been smoking about 1.00 pack per day. He has never used smokeless tobacco. He reports that he drinks alcohol. He reports that he uses drugs, including Marijuana and Cocaine.  Additional Social History:  Alcohol / Drug Use Pain Medications: see MAR Prescriptions: see MAR Over the Counter: see MAR History of alcohol / drug use?: Yes Longest period of sobriety (when/how long): 1 year and 5 months  Negative Consequences of Use: Personal relationships, Surveyor, quantity, Work / School Substance #1 Name of Substance 1: Cannabis 1 - Age of First Use: 15 1 - Amount (size/oz): less than 1 gram 1 - Frequency: social 1 - Duration: ongoing  1 - Last Use / Amount: 02/03/17 Substance #2 Name of Substance 2: IV Heroin 2 - Age of First Use: 21 2 - Amount (size/oz): .25 gram ($20-$50 worth) 2 - Frequency: daily 2 - Duration: ongoing 2 - Last Use / Amount: 02/03/17  CIWA: CIWA-Ar BP: 125/65 Pulse Rate: 87 COWS: Clinical Opiate Withdrawal Scale (COWS) Resting Pulse Rate: Pulse Rate 81-100 Sweating: Subjective report of chills or flushing Restlessness: Able to sit still Pupil Size: Pupils pinned or normal size for room  light Bone or Joint Aches: Not present Runny Nose or Tearing: Not present GI Upset: nausea or loose stool Tremor: No tremor Yawning: No yawning Anxiety or Irritability: None Gooseflesh Skin: Skin is smooth COWS Total Score: 4  PATIENT STRENGTHS: (choose at least two) Ability for insight Active sense of humor Average or above average intelligence Capable of independent living Communication skills General fund of knowledge Motivation for treatment/growth Physical Health Supportive family/friends  Allergies: No Known Allergies  Home Medications:  (Not in a hospital admission)  OB/GYN Status:  No LMP for male patient.  General Assessment Data [facility] Assessment: In system Is this a Tele or  Face-to-Face Assessment?: Tele Assessment Is this an Initial Assessment or a Re-assessment for this encounter?: Initial Assessment Marital status: Single Is patient pregnant?: No Pregnancy Status: No Living Arrangements: Other relatives (grandmother) Can pt return to current living arrangement?: Yes Admission Status: Voluntary Is patient capable of signing voluntary admission?: Yes Referral Source: Self/Family/Friend Insurance type: none     Crisis Care Plan Living Arrangements: Other relatives (grandmother) Name of Psychiatrist: none Name of Therapist: none  Education Status Is patient currently in school?: No Highest grade of school patient has completed: some college   Risk to self with the past 6 months Suicidal Ideation: No (pt denies to this Clinical research associate) Has patient been a risk to self within the past 6 months prior to admission? : No Suicidal Intent: No Has patient had any suicidal intent within the past 6 months prior to admission? : No Is patient at risk for suicide?: No Suicidal Plan?: No Has patient had any suicidal plan within the past 6 months prior to admission? : No Access to Means: No What has been your use of drugs/alcohol within the last 12 months?: reports to daily IV heroin use and social marijuana use  Previous Attempts/Gestures: No Triggers for Past Attempts: None known Intentional Self Injurious Behavior: None Family Suicide History: No Recent stressful life event(s): Other (Comment) (increased SA ) Persecutory voices/beliefs?: No Depression: No Substance abuse history and/or treatment for substance abuse?: Yes Suicide prevention information given to non-admitted patients: Not applicable  Risk to Others within the past 6 months Homicidal Ideation: No Does patient have any lifetime risk of violence toward others beyond the six months prior to admission? : No Thoughts of Harm to Others: No Current Homicidal Intent: No Current Homicidal Plan: No Access  to Homicidal Means: No History of harm to others?: No Assessment of Violence: None Noted Does patient have access to weapons?: No Criminal Charges Pending?: No Does patient have a court date: No Is patient on probation?: No  Psychosis Hallucinations: None noted Delusions: None noted  Mental Status Report Appearance/Hygiene: In scrubs, Unremarkable Eye Contact: Good Motor Activity: Freedom of movement Speech: Logical/coherent Level of Consciousness: Alert Mood: Pleasant Affect: Appropriate to circumstance Anxiety Level: None Thought Processes: Relevant, Coherent Judgement: Unimpaired Orientation: Person, Place, Time, Situation, Appropriate for developmental age Obsessive Compulsive Thoughts/Behaviors: None  Cognitive Functioning Concentration: Normal Memory: Remote Intact, Recent Intact IQ: Average Insight: Good Impulse Control: Fair Appetite: Good Sleep: Increased Total Hours of Sleep: 10 Vegetative Symptoms: Staying in bed  ADLScreening [facility] Assessment Services) Patient's cognitive ability adequate to safely complete daily activities?: Yes Patient able to express need for assistance with ADLs?: Yes Independently performs ADLs?: Yes (appropriate for developmental age)  Prior Inpatient Therapy Prior Inpatient Therapy: Yes Prior Therapy Dates: 2016 Prior Therapy Facilty/Provider(s): [facility] Reason for Treatment: Detox, SA treatment   Prior Outpatient Therapy Prior Outpatient  Therapy: Yes Prior Therapy Dates: 2015 Prior Therapy Facilty/Provider(s): Dr. Westley ChandlerKarr, MD Reason for Treatment: Med management  Does patient have an ACCT team?: No Does patient have Intensive In-House Services?  : No Does patient have [facility] services? : No Does patient have P4CC services?: No  ADL Screening (condition at time of admission) Patient's cognitive ability adequate to safely complete daily activities?: Yes Is the patient deaf or have difficulty hearing?: No Does the patient  have difficulty seeing, even when wearing glasses/contacts?: No Does the patient have difficulty concentrating, remembering, or making decisions?: No Patient able to express need for assistance with ADLs?: Yes Does the patient have difficulty dressing or bathing?: No Independently performs ADLs?: Yes (appropriate for developmental age) Does the patient have difficulty walking or climbing stairs?: No Weakness of Legs: None Weakness of Arms/Hands: None  Home Assistive Devices/Equipment Home Assistive Devices/Equipment: None    Abuse/Neglect Assessment (Assessment to be complete while patient is alone) Physical Abuse: Denies Verbal Abuse: Denies Sexual Abuse: Denies Exploitation of patient/patient's resources: Denies Self-Neglect: Denies     Merchant navy officerAdvance Directives (For Healthcare) Does Patient Have a Medical Advance Directive?: No Would patient like information on creating a medical advance directive?: No - Patient declined    Additional Information 1:1 In Past 12 Months?: No CIRT Risk: No Elopement Risk: No Does patient have medical clearance?: Yes     Disposition:  Disposition Initial Assessment Completed for this Encounter: Yes Disposition of Patient: Other dispositions Other disposition(s): Other (Comment) (AM psych eval per Nira ConnJason Berry, NP)  Karolee OhsAquicha R Samir Ishaq 02/04/2017 9:33 PM

## 2017-02-04 NOTE — ED Notes (Signed)
TTS assessing patient. 

## 2017-02-04 NOTE — ED Provider Notes (Addendum)
[facility]-EMERGENCY DEPT Provider Note   CSN: [id number] Arrival date & time: 02/04/17  1245     History   Chief Complaint Chief Complaint  Patient presents with  . Medical Clearance    Detox    HPI Chad James is a 26 y.o. male.  The history is provided by the patient. No language interpreter was used.    Chad RougeWalter D James is a 26 y.o. male who presents to the Emergency Department complaining of SI.  He has a history of heroin abuse over the last 5 years. He injects daily. Over the last several weeks he endorses SI with plan to overdose. He states these thoughts have come from situational stresses such as living situation. He does have a history of suicide attempt and overdose in the past on Xanax. He presents voluntarily. He denies any fevers, chest pain, abdominal pain, nausea, vomiting, diarrhea. Symptoms are moderate and constant nature.  Past Medical History:  Diagnosis Date  . Asthma   . Depression   . Heroin abuse     Patient Active Problem List   Diagnosis Date Noted  . Accidental overdose of heroin   . Sepsis (HCC) 06/23/2016  . Aspiration pneumonia (HCC) 06/23/2016  . Acute encephalopathy 06/23/2016  . IVDU (intravenous drug user) 06/23/2016    History reviewed. No pertinent surgical history.     Home Medications    Prior to Admission medications   Medication Sig Start Date End Date Taking? Authorizing Provider  acetaminophen (TYLENOL) 325 MG tablet Take 2 tablets (650 mg total) by mouth every 6 (six) hours as needed for mild pain or headache (or Fever >/= 101). 06/25/16   Vassie LollMadera, Carlos, MD  amoxicillin-clavulanate (AUGMENTIN) 875-125 MG tablet Take 1 tablet by mouth every 12 (twelve) hours. 06/25/16   Vassie LollMadera, Carlos, MD    Family History No family history on file.  Social History Social History  Substance Use Topics  . Smoking status: Current Every Day Smoker    Packs/day: 1.00    Types: Cigarettes  . Smokeless tobacco: Never Used  . Alcohol use Yes      Comment: occ     Allergies   Patient has no known allergies.   Review of Systems Review of Systems  All other systems reviewed and are negative.    Physical Exam Updated Vital Signs BP 113/78 (BP Location: Right Arm)   Pulse (!) 106   Temp 98.2 F (36.8 C) (Oral)   Resp 18   SpO2 99%   Physical Exam  Constitutional: He is oriented to person, place, and time. He appears well-developed and well-nourished.  HENT:  Head: Normocephalic and atraumatic.  Cardiovascular: Normal rate and regular rhythm.   No murmur heard. Pulmonary/Chest: Effort normal and breath sounds normal. No respiratory distress.  Abdominal: Soft. There is no tenderness. There is no rebound and no guarding.  Musculoskeletal: He exhibits no edema or tenderness.  Neurological: He is alert and oriented to person, place, and time.  Skin: Skin is warm and dry.  Psychiatric: He has a normal mood and affect. His behavior is normal.  Nursing note and vitals reviewed.    ED Treatments / Results  Labs (all labs ordered are listed, but only abnormal results are displayed) Labs Reviewed  COMPREHENSIVE METABOLIC PANEL - Abnormal; Notable for the following:       Result Value   ALT 16 (*)    All other components within normal limits  ACETAMINOPHEN LEVEL - Abnormal; Notable for the following:  Acetaminophen (Tylenol), Serum <10 (*)    All other components within normal limits  CBC - Abnormal; Notable for the following:    Hemoglobin 12.8 (*)    All other components within normal limits  ETHANOL  SALICYLATE LEVEL  RAPID URINE DRUG SCREEN, HOSP PERFORMED    EKG  EKG Interpretation None       Radiology No results found.  Procedures Procedures (including critical care time)  Medications Ordered in ED Medications - No data to display   Initial Impression / Assessment and Plan / ED Course  I have reviewed the triage vital signs and the nursing notes.  Pertinent labs & imaging results that  were available during my care of the patient were reviewed by me and considered in my medical decision making (see chart for details).     Pt with hx/o heroin abuse here with SI and plan.  He presents voluntarily for treatment. He has been medically cleared for psychiatric evaluation and treatment.  Patient requesting evaluation for right wrist weakness. Over the last 2 weeks she's been unable to extend his right wrist. His symptoms have been constant nature. He has symmetric grip strength bilaterally with sensation to light touch intact throughout the hands. He does have absent wrist extension. Examination is consistent with wristdrop or Saturday Night palsy. D/w pt outpatient Neurology follow up, may splint for comfort.    Final Clinical Impressions(s) / ED Diagnoses   Final diagnoses:  None    New Prescriptions New Prescriptions   No medications on file     Tilden Fossa, MD 02/05/17 1610    Tilden Fossa, MD 02/05/17 [phone]

## 2017-02-04 NOTE — ED Notes (Signed)
Pt informed staff needed a urine sample. Urinal and sample cup given to patient

## 2017-02-04 NOTE — ED Notes (Signed)
Pt came to desk and used phone to call family. Appropriate tones and actions were seen. NAD noted

## 2017-02-04 NOTE — ED Triage Notes (Signed)
PT is here for detox from IV heroin and suicidal ideations.

## 2017-02-04 NOTE — ED Notes (Signed)
ED Provider at bedside. 

## 2017-02-04 NOTE — ED Triage Notes (Signed)
Pt is here to get detox from heroin (IV).  Last use was yesterday at 1pm. No withdrawal symptoms yet.

## 2017-02-04 NOTE — ED Notes (Signed)
Family member called requesting information about patient. RN informed family member about not being able to release any information about patient or condition and RN would tell patient she called and he could call her if he chose during phone times

## 2017-02-04 NOTE — ED Notes (Signed)
MD notified of patients "drop wrist". Pt states he went to sleep on it on day and since then he has not been able to extend wrist..he is able to move his fingers but has end thumb numbness.

## 2017-02-05 DIAGNOSIS — F1190 Opioid use, unspecified, uncomplicated: Secondary | ICD-10-CM

## 2017-02-05 DIAGNOSIS — F17210 Nicotine dependence, cigarettes, uncomplicated: Secondary | ICD-10-CM

## 2017-02-05 DIAGNOSIS — F1910 Other psychoactive substance abuse, uncomplicated: Secondary | ICD-10-CM

## 2017-02-05 DIAGNOSIS — F1290 Cannabis use, unspecified, uncomplicated: Secondary | ICD-10-CM

## 2017-02-05 NOTE — ED Notes (Signed)
Pt's mother called and advised she is pulling up - pt escorted to lobby.

## 2017-02-05 NOTE — ED Notes (Signed)
Pt aware of tx plan - d/c to home. Pt called his mother and advised may be an hour or so d/t waiting on paperwork.

## 2017-02-05 NOTE — ED Notes (Signed)
Pt given snacks as requested.

## 2017-02-05 NOTE — ED Notes (Addendum)
Pt called his mother to ensure she is on her way to ED. States she will be here in approx 30 min.

## 2017-02-05 NOTE — ED Notes (Signed)
Pt called his mother to advise of d/c. Advised she will be here in approx 10-15 min.

## 2017-02-05 NOTE — ED Notes (Signed)
Telepsych being performed. 

## 2017-02-05 NOTE — Progress Notes (Addendum)
Per Ferne ReusJustina Okonkwo, NP, the patient does not meet criteria for inpatient treatment. Patient is recommended for discharge and to follow up with outpatient providers.   CSW provided substance abuse treatment resources to patient's nurse.   Patient reports he has a pending bed at [facility] for substance abuse treatment.   Jerrol Bananaebecca Berman, RN notified.   Baldo DaubJolan Johnryan Sao MSW, LCSWA CSW Disposition [phone]

## 2017-02-05 NOTE — ED Notes (Signed)
Pt's mother called asking why pt does not "have a room yet. I left him at 4:00 yesterday and I want to know why!" RN attempted to advise mother of process - mother continued yelling. RN gave pt phone so he may speak w/her. Pt signed release of information form so mother may have info. Pt states he only said he was "SI" d/t his mother telling him to say that in attempt to get him placed somewhere. Pt denies SI/HI.

## 2017-02-05 NOTE — ED Notes (Signed)
States "I feel fine right now - just tired". States came to ED for Heroin detox. States is on waitlist for [facility] [place] - 1 year tx program. [place]

## 2017-02-05 NOTE — Consult Note (Signed)
Telepsych Consultation   Reason for Consult: SI and detox Referring Physician: EDP Patient Identification: Chad James MRN:  [id number] Principal Diagnosis: <principal problem not specified> Diagnosis:   Patient Active Problem List   Diagnosis Date Noted  . Accidental overdose of heroin [T40.1X1A]   . Sepsis ([place]) [A41.9] 06/23/2016  . Aspiration pneumonia ([place]) [J69.0] 06/23/2016  . Acute encephalopathy [G93.40] 06/23/2016  . IVDU (intravenous drug user) [F19.90] 06/23/2016    Total Time spent with patient: 30 minutes  Subjective:   Chad James is a 26 y.o. male patient admitted with Opiod use d/o, cannabis use d/o  HPI: Per the assessment completed 02/04/17 by Lind Covert: Chad James is an 26 y.o. male who presents to the ED voluntarily. Pt reports he is requesting to detox from heroin. Pt states he is an IV heroin user and last use was 02/03/17 at 13:00. Pt reports he is not suicidal and states his mom told him to "say the S word" because that would guarantee him a bed. Pt stated "I just want to apologize because I just went about it the wrong way. I'm not suicidal and I've never been suicidal. My mom told me to say the S word and they would let me stay." Pt reports a hx of substance abuse treatment and states his longest period of sobriety was 1 year and 5 months. Pt reports he was supposed to go to detox at [facility] this morning but he was unable to go because he does not have an ID. Pt reports his mother drove him to the [facility] in order to obtain an ID but they were told they would not have time to get to him today therefore he came to the hospital instead for detox. Pt reports he is on the wait list for [facility] to attend their 1 year SA treatment program. Pt states he has to call [facility] every 2 weeks to ensure he remains on the wait list. Pt reports he has also been to [facility] in the past but he does not feel they were helpful. Pt appears pleasant during the assessment. Pt was asked if he has  experienced any changes in his sleeping and eating habits and he stated "well this bed is uncomfortable" and began to laugh. Pt states he "wants to be a good son and a good role model for his brothers." Pt reports he wants his family to be proud of him and feels that he is "ready to get off of drugs."   On Exam: Patient was seen via tele-psych, chart reviewed with treatment team. Patient in bed, awake, alert and oriented x4. Patient reiterated the reason for this hospital admission as documented above. Patient stated, "I came here to get detox for my drug use and I'm sorry that I lied to say the "S" word (that I'm suicidal). I'm not suicidal or homicidal". Patient stated that he made that statement because his mom made her do it. He reported that he just wanted to get detox from heroin use. He stated that he went to [facility] yesterday for inpatient detox program but wasn't admitted because he didn't have his ID. He reported being on the waiting list for a one year treatment program in [place]. He currently denies any SI/HI/VAH and reported that his mom is his support system. He said that he is ready to come off drugs and stay cleaned. 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 Patient stated he is willing and committed to following through with treatment and regaining his life back.    Past Psychiatric History: See H&P  Risk to Self: Suicidal Ideation: No (pt denies to this Probation officer) Suicidal Intent: No Is patient at risk for suicide?: No Suicidal Plan?: No Access to Means: No What has been your use of drugs/alcohol within the last 12 months?: reports to daily IV heroin use and social marijuana use  Triggers for Past Attempts: None known Intentional Self Injurious Behavior: None Risk to Others: Homicidal Ideation: No Thoughts of Harm to Others: No Current Homicidal Intent: No Current Homicidal Plan: No Access to Homicidal  Means: No History of harm to others?: No Assessment of Violence: None Noted Does patient have access to weapons?: No Criminal Charges Pending?: No Does patient have a court date: No Prior Inpatient Therapy: Prior Inpatient Therapy: Yes Prior Therapy Dates: 2016 Prior Therapy Facilty/Provider(s): [facility] Reason for Treatment: Detox, SA treatment  Prior Outpatient Therapy: Prior Outpatient Therapy: Yes Prior Therapy Dates: 2015 Prior Therapy Facilty/Provider(s): Dr. Wylene Simmer, MD Reason for Treatment: Med management  Does patient have an ACCT team?: No Does patient have Intensive In-House Services?  : No Does patient have [facility] services? : No Does patient have P4[facility] services?: No  Past Medical History:  Past Medical History:  Diagnosis Date  . Asthma   . Depression   . Heroin abuse    History reviewed. No pertinent surgical history. Family History: No family history on file. Family Psychiatric  History:   Social History:  History  Alcohol Use  . Yes    Comment: occ     History  Drug Use  . Types: Marijuana, Cocaine    Social History   Social History  . Marital status: Single    Spouse name: N/A  . Number of children: N/A  . Years of education: N/A   Social History Main Topics  . Smoking status: Current Every Day Smoker    Packs/day: 1.00    Types: Cigarettes  . Smokeless tobacco: Never Used  . Alcohol use Yes     Comment: occ  . Drug use: Yes    Types: Marijuana, Cocaine  . Sexual activity: Not Asked     Comment: no drug use since December   Other Topics Concern  . None   Social History Narrative  . None   Additional Social History:    Allergies:  No Known Allergies  Labs:  Results for orders placed or performed during the hospital encounter of 02/04/17 (from the past 48 hour(s))  Comprehensive metabolic panel     Status: Abnormal   Collection Time: 02/04/17  1:09 PM  Result Value Ref Range   Sodium 141 135 - 145 mmol/L   Potassium 3.8 3.5 -  5.1 mmol/L   Chloride 107 101 - 111 mmol/L   CO2 23 22 - 32 mmol/L   Glucose, Bld 92 65 - 99 mg/dL   BUN 12 6 - 20 mg/dL   Creatinine, Ser 0.87 0.61 - 1.24 mg/dL   Calcium 9.0 8.9 - 10.3 mg/dL   Total Protein 6.5 6.5 - 8.1 g/dL   Albumin 3.7 3.5 - 5.0 g/dL   AST 19 15 - 41 U/L   ALT 16 (L) 17 - 63 U/L   Alkaline Phosphatase 84 38 - 126 U/L   Total Bilirubin 0.4 0.3 - 1.2 mg/dL   GFR calc non Af Amer >60 >60 mL/min   GFR calc Af Amer >60 >  60 mL/min    Comment: (NOTE) The eGFR has been calculated using the CKD EPI equation. This calculation has not been validated in all clinical situations. eGFR's persistently <60 mL/min signify possible Chronic Kidney Disease.    Anion gap 11 5 - 15  Ethanol     Status: None   Collection Time: 02/04/17  1:09 PM  Result Value Ref Range   Alcohol, Ethyl (B) <5 <5 mg/dL    Comment:        LOWEST DETECTABLE LIMIT FOR SERUM ALCOHOL IS 5 mg/dL FOR MEDICAL PURPOSES ONLY   Salicylate level     Status: None   Collection Time: 02/04/17  1:09 PM  Result Value Ref Range   Salicylate Lvl <6.6 2.8 - 30.0 mg/dL  Acetaminophen level     Status: Abnormal   Collection Time: 02/04/17  1:09 PM  Result Value Ref Range   Acetaminophen (Tylenol), Serum <10 (L) 10 - 30 ug/mL    Comment:        THERAPEUTIC CONCENTRATIONS VARY SIGNIFICANTLY. A RANGE OF 10-30 ug/mL MAY BE AN EFFECTIVE CONCENTRATION FOR MANY PATIENTS. HOWEVER, SOME ARE BEST TREATED AT CONCENTRATIONS OUTSIDE THIS RANGE. ACETAMINOPHEN CONCENTRATIONS >150 ug/mL AT 4 HOURS AFTER INGESTION AND >50 ug/mL AT 12 HOURS AFTER INGESTION ARE OFTEN ASSOCIATED WITH TOXIC REACTIONS.   cbc     Status: Abnormal   Collection Time: 02/04/17  1:09 PM  Result Value Ref Range   WBC 6.6 4.0 - 10.5 K/uL   RBC 4.48 4.22 - 5.81 MIL/uL   Hemoglobin 12.8 (L) 13.0 - 17.0 g/dL   HCT 40.0 39.0 - 52.0 %   MCV 89.3 78.0 - 100.0 fL   MCH 28.6 26.0 - 34.0 pg   MCHC 32.0 30.0 - 36.0 g/dL   RDW 14.6 11.5 - 15.5 %    Platelets 218 150 - 400 K/uL  Rapid urine drug screen (hospital performed)     Status: Abnormal   Collection Time: 02/04/17  5:02 PM  Result Value Ref Range   Opiates NONE DETECTED NONE DETECTED   Cocaine NONE DETECTED NONE DETECTED   Benzodiazepines NONE DETECTED NONE DETECTED   Amphetamines NONE DETECTED NONE DETECTED   Tetrahydrocannabinol POSITIVE (A) NONE DETECTED   Barbiturates NONE DETECTED NONE DETECTED    Comment:        DRUG SCREEN FOR MEDICAL PURPOSES ONLY.  IF CONFIRMATION IS NEEDED FOR ANY PURPOSE, NOTIFY LAB WITHIN 5 DAYS.        LOWEST DETECTABLE LIMITS FOR URINE DRUG SCREEN Drug Class       Cutoff (ng/mL) Amphetamine      1000 Barbiturate      200 Benzodiazepine   599 Tricyclics       357 Opiates          300 Cocaine          300 THC              50     Current Facility-Administered Medications  Medication Dose Route Frequency Provider Last Rate Last Dose  . cloNIDine (CATAPRES) tablet 0.1 mg  0.1 mg Oral BID PRN Quintella Reichert, MD   0.1 mg at 02/04/17 2058  . ibuprofen (ADVIL,MOTRIN) tablet 400 mg  400 mg Oral Q6H PRN Quintella Reichert, MD   400 mg at 02/04/17 2059  . ondansetron (ZOFRAN-ODT) disintegrating tablet 4 mg  4 mg Oral Q6H PRN Quintella Reichert, MD   4 mg at 02/04/17 2059   Current Outpatient Prescriptions  Medication Sig  Dispense Refill  . acetaminophen (TYLENOL) 325 MG tablet Take 2 tablets (650 mg total) by mouth every 6 (six) hours as needed for mild pain or headache (or Fever >/= 101). (Patient not taking: Reported on 02/04/2017) 30 tablet 0  . amoxicillin-clavulanate (AUGMENTIN) 875-125 MG tablet Take 1 tablet by mouth every 12 (twelve) hours. (Patient not taking: Reported on 02/04/2017) 12 tablet 0    Musculoskeletal: UTA via camera  Psychiatric Specialty Exam: Physical Exam  Nursing note and vitals reviewed.   Review of Systems  Psychiatric/Behavioral: Positive for substance abuse. Negative for depression, hallucinations, memory loss and  suicidal ideas. The patient is not nervous/anxious and does not have insomnia.   All other systems reviewed and are negative.   Blood pressure 117/75, pulse 70, temperature 98.3 F (36.8 C), temperature source Oral, resp. rate 18, SpO2 99 %.There is no height or weight on file to calculate BMI.  General Appearance: on hospital scrub  Eye Contact:  Good  Speech:  Clear and Coherent and Normal Rate  Volume:  Normal  Mood:  Euthymic  Affect:  Appropriate and Congruent  Thought Process:  Coherent and Goal Directed  Orientation:  Full (Time, Place, and Person)  Thought Content:  WDL and Logical  Suicidal Thoughts:  No  Homicidal Thoughts:  No  Memory:  Immediate;   Good Recent;   Good Remote;   Fair  Judgement:  Good  Insight:  Good and Present  Psychomotor Activity:  Normal  Concentration:  Concentration: Good and Attention Span: Good  Recall:  Good  Fund of Knowledge:  Good  Language:  Good  Akathisia:  Negative  Handed:  Right  AIMS (if indicated):     Assets:  Communication Skills Desire for Improvement Financial Resources/Insurance Housing Leisure Time Physical Health Resilience Social Support Talents/Skills  ADL's:  Intact  Cognition:  WNL  Sleep:        Treatment Plan Summary: Plan to discharge with OP resources for substance abuse treatment facilites  Patient denies any SI/HI/VAH Patient is not meeting IP criteria.  Disposition: No evidence of imminent risk to self or others at present.   Patient does not meet criteria for psychiatric inpatient admission. Supportive therapy provided about ongoing stressors. Refer to IOP. Discussed crisis plan, support from social network, calling 911, coming to the Emergency Department, and calling Suicide Hotline.   Vicenta Aly, NP 02/05/2017 12:10 PM

## 2017-02-05 NOTE — Discharge Instructions (Signed)
Call any of the numbers to get help with your drug problem and to get psychiatric help. If you think he may harm yourself or someone else call 911 immediately

## 2017-02-05 NOTE — ED Provider Notes (Addendum)
Sleeping comfortably. Stable   Chad James, Artemisia Auvil, MD 02/05/17 [id number] 2 PM patient alert ambulatory pleasant cooperative Glasgow Coma Score 15. He is stable for discharge. He denies any thought of wanting to harm himself or someone else. Results for orders placed or performed during the hospital encounter of 02/04/17  Comprehensive metabolic panel  Result Value Ref Range   Sodium 141 135 - 145 mmol/L   Potassium 3.8 3.5 - 5.1 mmol/L   Chloride 107 101 - 111 mmol/L   CO2 23 22 - 32 mmol/L   Glucose, Bld 92 65 - 99 mg/dL   BUN 12 6 - 20 mg/dL   Creatinine, Ser 8.290.87 0.61 - 1.24 mg/dL   Calcium 9.0 8.9 - 56.210.3 mg/dL   Total Protein 6.5 6.5 - 8.1 g/dL   Albumin 3.7 3.5 - 5.0 g/dL   AST 19 15 - 41 U/L   ALT 16 (L) 17 - 63 U/L   Alkaline Phosphatase 84 38 - 126 U/L   Total Bilirubin 0.4 0.3 - 1.2 mg/dL   GFR calc non Af Amer >60 >60 mL/min   GFR calc Af Amer >60 >60 mL/min   Anion gap 11 5 - 15  Ethanol  Result Value Ref Range   Alcohol, Ethyl (B) <5 <5 mg/dL  Salicylate level  Result Value Ref Range   Salicylate Lvl <7.0 2.8 - 30.0 mg/dL  Acetaminophen level  Result Value Ref Range   Acetaminophen (Tylenol), Serum <10 (L) 10 - 30 ug/mL  cbc  Result Value Ref Range   WBC 6.6 4.0 - 10.5 K/uL   RBC 4.48 4.22 - 5.81 MIL/uL   Hemoglobin 12.8 (L) 13.0 - 17.0 g/dL   HCT 13.040.0 86.539.0 - 78.452.0 %   MCV 89.3 78.0 - 100.0 fL   MCH 28.6 26.0 - 34.0 pg   MCHC 32.0 30.0 - 36.0 g/dL   RDW 69.614.6 29.511.5 - 28.415.5 %   Platelets 218 150 - 400 K/uL  Rapid urine drug screen (hospital performed)  Result Value Ref Range   Opiates NONE DETECTED NONE DETECTED   Cocaine NONE DETECTED NONE DETECTED   Benzodiazepines NONE DETECTED NONE DETECTED   Amphetamines NONE DETECTED NONE DETECTED   Tetrahydrocannabinol POSITIVE (A) NONE DETECTED   Barbiturates NONE DETECTED NONE DETECTED   No results found.   Chad James, Glennda Weatherholtz, MD 02/05/17 [phone]

## 2017-02-05 NOTE — ED Notes (Signed)
[facility] - copy to [facility], original on clipboard.

## 2018-03-28 ENCOUNTER — Other Ambulatory Visit: Payer: Self-pay

## 2018-03-28 ENCOUNTER — Encounter (HOSPITAL_BASED_OUTPATIENT_CLINIC_OR_DEPARTMENT_OTHER): Payer: Self-pay | Admitting: *Deleted

## 2018-03-28 ENCOUNTER — Emergency Department (HOSPITAL_BASED_OUTPATIENT_CLINIC_OR_DEPARTMENT_OTHER)
Admission: EM | Admit: 2018-03-28 | Discharge: 2018-03-28 | Disposition: A | Discharge status: Home / Self Care | Payer: Self-pay | Attending: Emergency Medicine | Admitting: Emergency Medicine

## 2018-03-28 DIAGNOSIS — F17210 Nicotine dependence, cigarettes, uncomplicated: Secondary | ICD-10-CM | POA: Insufficient documentation

## 2018-03-28 DIAGNOSIS — Y999 Unspecified external cause status: Secondary | ICD-10-CM | POA: Insufficient documentation

## 2018-03-28 DIAGNOSIS — Y9301 Activity, walking, marching and hiking: Secondary | ICD-10-CM | POA: Insufficient documentation

## 2018-03-28 DIAGNOSIS — M25562 Pain in left knee: Secondary | ICD-10-CM | POA: Insufficient documentation

## 2018-03-28 DIAGNOSIS — W0110XA Fall on same level from slipping, tripping and stumbling with subsequent striking against unspecified object, initial encounter: Secondary | ICD-10-CM | POA: Insufficient documentation

## 2018-03-28 DIAGNOSIS — Y929 Unspecified place or not applicable: Secondary | ICD-10-CM | POA: Insufficient documentation

## 2018-03-28 DIAGNOSIS — J45909 Unspecified asthma, uncomplicated: Secondary | ICD-10-CM | POA: Insufficient documentation

## 2018-03-28 NOTE — ED Notes (Signed)
Pt friend at bedside states pts eyes was slanted and face was flushed and looked weak; pt denies weakness or drug use. States had 2 beers on sunday

## 2018-03-28 NOTE — ED Notes (Signed)
Pt verbalizes understanding of d/c instructions and denies any further needs at this time. 

## 2018-03-28 NOTE — ED Triage Notes (Signed)
Pt c/o generalized weakness and left knee injury x 1 day ago , pt in triageeating burger and fries

## 2018-03-28 NOTE — ED Provider Notes (Signed)
[facility] EMERGENCY DEPARTMENT Provider Note   CSN: [id number] Arrival date & time: 03/28/18  1618     History   Chief Complaint Chief Complaint  Patient presents with  . Weakness    HPI Chad James is a 27 y.o. male.  HPI  Patient is a 27 year old male with history of asthma, depression, heroin abuse who presents the emergency department today for evaluation of left knee pain.  Upon entering the room patient states, "I think I am fine and I think I can just leave ma'am".  Patient states that last night he went to the bathroom and felt like his knee gave out on him and he fell onto his back and bottom.  Denies head trauma or LOC but states that he had left knee pain after the fall.  States that pain has improved since the fall and he has been ambulatory without any pain since the accident.  States that he was at his job prior to arrival and went to HR to talk about his left knee injury.  HR staff felt that his face "looked more red than normal "and sent him to the emergency department for evaluation.  Patient denies any symptoms at this time including fevers, chills, weakness, chest pain or shortness of breath.  He feels like his left knee is "numb "just on the patella. No exacerbating or alleviating factors.  Past Medical History:  Diagnosis Date  . Asthma   . Depression   . Heroin abuse [facility])     Patient Active Problem List   Diagnosis Date Noted  . Accidental overdose of heroin ([facility])   . Sepsis ([facility]) 06/23/2016  . Aspiration pneumonia ([facility]) 06/23/2016  . Acute encephalopathy 06/23/2016  . IVDU (intravenous drug user) 06/23/2016    History reviewed. No pertinent surgical history.      Home Medications    Prior to Admission medications   Not on File    Family History History reviewed. No pertinent family history.  Social History Social History   Tobacco Use  . Smoking status: Current Every Day Smoker    Packs/day: 1.00    Types: Cigarettes  .  Smokeless tobacco: Never Used  Substance Use Topics  . Alcohol use: Yes    Comment: occ  . Drug use: Yes    Types: Marijuana, Cocaine     Allergies   Patient has no known allergies.   Review of Systems Review of Systems  Constitutional: Negative for chills and fever.       Face more red than normal  Musculoskeletal:       Left knee pain  Skin: Negative for wound.  Neurological: Positive for numbness. Negative for weakness.     Physical Exam Updated Vital Signs BP 118/73   Pulse 86   Resp 16   Ht 6\' 3"  (1.905 m)   Wt 63.5 kg   SpO2 98%   BMI 17.50 kg/m   Physical Exam  Constitutional: He is oriented to person, place, and time. He appears well-developed and well-nourished. No distress.  HENT:  Head: Normocephalic and atraumatic.  Mouth/Throat: Oropharynx is clear and moist.  Eyes: Conjunctivae are normal.  Cardiovascular: Normal rate.  Pulmonary/Chest: Effort normal.  Musculoskeletal:  No tenderness throughout the entirety of the knee.  Patient reportedly has decreased sensation just over the patella however has intact sensation of pain to the area.  Sensation normal and symmetric to the remainder of the bilateral lower extremities.  Strength 5/5 to bilateral lower  extremities.  Patellar reflexes 2+ bilaterally.  Achilles reflexes 1+ bilaterally.  Patient ambulatory with steady gait no acute distress.  Neurological: He is alert and oriented to person, place, and time.  Skin: Skin is warm and dry. Capillary refill takes less than 2 seconds.  Psychiatric:  anxious    ED Treatments / Results  Labs (all labs ordered are listed, but only abnormal results are displayed) Labs Reviewed - No data to display  EKG None  Radiology No results found.  Procedures Procedures (including critical care time)  Medications Ordered in ED Medications - No data to display   Initial Impression / Assessment and Plan / ED Course  I have reviewed the triage vital signs and the  nursing notes.  Pertinent labs & imaging results that were available during my care of the patient were reviewed by me and considered in my medical decision making (see chart for details).     Final Clinical Impressions(s) / ED Diagnoses   Final diagnoses:  Acute pain of left knee   Patient presented the ED today for evaluation of left knee pain, "numbness "that began after a fall.  He actually states that his pain has resolved.  He has no tenderness on exam.  He reports reduced sensation just over the patella however pain sensation is intact in the remainder of sensation to his bilateral lower extremities is normal and symmetric.  He is ambulatory.  There is no swelling or erythema to the left knee.  Patient has full range of motion.  He does not feel that he needs an x-ray of his knee and I agree with this assessment.  He is very well-appearing.  I have not identified any emergent life-threatening process at this time and feel the patient is stable for discharge with outpatient follow-up.  Advised to return if new or worsening symptoms develop.  All questions answered.  ED Discharge Orders    None       Karrie Meres, [place] 03/28/18 1653    Tegeler, Canary Brim, MD 03/28/18 [phone]

## 2018-03-28 NOTE — Discharge Instructions (Signed)
Please follow up with your primary care provider within 5-7 days for re-evaluation of your symptoms. If you do not have a primary care provider, information for a healthcare clinic has been provided for you to make arrangements for follow up care. Please return to the emergency department for any new or worsening symptoms. ° °

## 2018-05-11 ENCOUNTER — Other Ambulatory Visit: Payer: Self-pay

## 2018-05-11 ENCOUNTER — Encounter (HOSPITAL_COMMUNITY): Payer: Self-pay

## 2018-05-11 ENCOUNTER — Emergency Department (HOSPITAL_COMMUNITY)
Admission: EM | Admit: 2018-05-11 | Discharge: 2018-05-11 | Disposition: A | Discharge status: Home / Self Care | Payer: Self-pay | Attending: Emergency Medicine | Admitting: Emergency Medicine

## 2018-05-11 DIAGNOSIS — T401X1A Poisoning by heroin, accidental (unintentional), initial encounter: Secondary | ICD-10-CM | POA: Insufficient documentation

## 2018-05-11 DIAGNOSIS — F17210 Nicotine dependence, cigarettes, uncomplicated: Secondary | ICD-10-CM | POA: Insufficient documentation

## 2018-05-11 DIAGNOSIS — J45909 Unspecified asthma, uncomplicated: Secondary | ICD-10-CM | POA: Insufficient documentation

## 2018-05-11 LAB — COMPREHENSIVE METABOLIC PANEL
ALBUMIN: 4.3 g/dL (ref 3.5–5.0)
ALK PHOS: 74 U/L (ref 38–126)
ALT: 153 U/L — AB (ref 0–44)
AST: 98 U/L — AB (ref 15–41)
Anion gap: 7 (ref 5–15)
BUN: 10 mg/dL (ref 6–20)
CO2: 25 mmol/L (ref 22–32)
CREATININE: 0.94 mg/dL (ref 0.61–1.24)
Calcium: 9.2 mg/dL (ref 8.9–10.3)
Chloride: 107 mmol/L (ref 98–111)
GFR calc Af Amer: >60 mL/min (ref >60)
GFR calc non Af Amer: >60 mL/min (ref >60)
GLUCOSE: 112 mg/dL — AB (ref 70–99)
Potassium: 4.1 mmol/L (ref 3.5–5.1)
SODIUM: 139 mmol/L (ref 135–145)
Total Bilirubin: 0.7 mg/dL (ref 0.3–1.2)
Total Protein: 7 g/dL (ref 6.5–8.1)

## 2018-05-11 LAB — CBC WITH DIFFERENTIAL/PLATELET
ABS IMMATURE GRANULOCYTES: 0.07 10*3/uL (ref 0.00–0.07)
Basophils Absolute: 0 10*3/uL (ref 0.0–0.1)
Basophils Relative: 0 %
Eosinophils Absolute: 0 10*3/uL (ref 0.0–0.5)
Eosinophils Relative: 0 %
HCT: 47 % (ref 39.0–52.0)
HEMOGLOBIN: 14.2 g/dL (ref 13.0–17.0)
Immature Granulocytes: 1 %
LYMPHS PCT: 8 %
Lymphs Abs: 1 10*3/uL (ref 0.7–4.0)
MCH: 28.5 pg (ref 26.0–34.0)
MCHC: 30.2 g/dL (ref 30.0–36.0)
MCV: 94.2 fL (ref 80.0–100.0)
MONO ABS: 0.5 10*3/uL (ref 0.1–1.0)
MONOS PCT: 4 %
NEUTROS ABS: 11 10*3/uL — AB (ref 1.7–7.7)
Neutrophils Relative %: 87 %
Platelets: 237 10*3/uL (ref 150–400)
RBC: 4.99 MIL/uL (ref 4.22–5.81)
RDW: 13.2 % (ref 11.5–15.5)
WBC: 12.6 10*3/uL — AB (ref 4.0–10.5)
nRBC: 0 % (ref 0.0–0.2)

## 2018-05-11 LAB — URINALYSIS, ROUTINE W REFLEX MICROSCOPIC
BILIRUBIN URINE: NEGATIVE
Glucose, UA: NEGATIVE mg/dL
HGB URINE DIPSTICK: NEGATIVE
KETONES UR: NEGATIVE mg/dL
Leukocytes, UA: NEGATIVE
NITRITE: NEGATIVE
Protein, ur: NEGATIVE mg/dL
SPECIFIC GRAVITY, URINE: 1.013 (ref 1.005–1.030)
pH: 6 (ref 5.0–8.0)

## 2018-05-11 LAB — SALICYLATE LEVEL: Salicylate Lvl: <7 mg/dL (ref 2.8–30.0)

## 2018-05-11 LAB — ETHANOL: Alcohol, Ethyl (B): <10 mg/dL (ref <10)

## 2018-05-11 LAB — RAPID URINE DRUG SCREEN, HOSP PERFORMED
AMPHETAMINES: POSITIVE — AB
Barbiturates: NOT DETECTED
Benzodiazepines: NOT DETECTED
Cocaine: POSITIVE — AB
OPIATES: POSITIVE — AB
TETRAHYDROCANNABINOL: POSITIVE — AB

## 2018-05-11 LAB — ACETAMINOPHEN LEVEL: Acetaminophen (Tylenol), Serum: <10 ug/mL — ABNORMAL LOW (ref 10–30)

## 2018-05-11 MED ORDER — NALOXONE HCL 4 MG/0.1ML NA LIQD: NASAL | 0 refills | Status: DC

## 2018-05-11 MED ORDER — NALOXONE HCL 0.4 MG/ML IJ SOLN
INTRAMUSCULAR | Status: AC
  Administered 2018-05-11: 17:00:00
  Filled 2018-05-11: qty 1

## 2018-05-11 NOTE — ED Notes (Signed)
Narcan given, pt now stating he has a headache, does not remember what happened prior to arrival. Pt now crying.

## 2018-05-11 NOTE — ED Provider Notes (Signed)
[facility] EMERGENCY DEPARTMENT Provider Note   CSN: [id number] Arrival date & time: 05/11/18  1622     History   Chief Complaint Chief Complaint  Patient presents with  . Drug Overdose    HPI Chad James is a 27 y.o. male.  HPI Patient is a 27 year old male with a past medical history of heroin abuse, cocaine abuse, marijuana use, asthma, and depression who presents emerged department for evaluation calling an overdose on heroin.  Patient reportedly injected heroin approximately 45 minutes prior to arrival.  He was found by his mother who called EMS.  They state that upon arrival patient was somnolent, but protecting his own airway.  His vital signs were stable outside of mild tachycardia.  Narcan was not given prior to arrival to the emergency department as patient continued to have spontaneous respirations at a rate of 12-16.  He was never hypoxic with EMS.  States that he has continued to be sleepy but does respond to verbal stimuli.  Patient endorses that his girlfriend broke up with him today and this is why he used heroin for the first time in 1 year.  Patient was given Narcan upon arrival to the emergency department with improvement to his mental status and his respiratory drive.  Patient endorses headache following Narcan administration but no other complaints.  Remaining review of systems as below.  Past Medical History:  Diagnosis Date  . Asthma   . Depression   . Heroin abuse [facility])     Patient Active Problem List   Diagnosis Date Noted  . Accidental overdose of heroin (HCC)   . Sepsis (HCC) 06/23/2016  . Aspiration pneumonia (HCC) 06/23/2016  . Acute encephalopathy 06/23/2016  . IVDU (intravenous drug user) 06/23/2016    History reviewed. No pertinent surgical history.      Home Medications    Prior to Admission medications   Medication Sig Start Date End Date Taking? Authorizing Provider  naloxone [facility]) nasal spray 4 mg/0.1 mL  Please give this medication intranasally should there be concern for overdose. 05/11/18   Keylani Perlstein, Winfield Rast, MD    Family History History reviewed. No pertinent family history.  Social History Social History   Tobacco Use  . Smoking status: Current Every Day Smoker    Packs/day: 1.00    Types: Cigarettes  . Smokeless tobacco: Never Used  Substance Use Topics  . Alcohol use: Yes    Comment: occ  . Drug use: Yes    Types: Marijuana, Cocaine, IV    Comment: IV heroin use today 11/14     Allergies   Patient has no known allergies.   Review of Systems Review of Systems  Constitutional: Negative for chills and fever.  HENT: Negative for ear pain and sore throat.   Eyes: Negative for pain and visual disturbance.  Respiratory: Negative for cough and shortness of breath.   Cardiovascular: Negative for chest pain and palpitations.  Gastrointestinal: Negative for abdominal pain and vomiting.  Genitourinary: Negative for dysuria and hematuria.  Musculoskeletal: Negative for arthralgias and back pain.  Skin: Negative for color change and rash.  Neurological: Positive for headaches. Negative for seizures and syncope.  Psychiatric/Behavioral: Negative for agitation.  All other systems reviewed and are negative.    Physical Exam Updated Vital Signs BP 111/72   Pulse 99   Temp 98.4 F (36.9 C) (Oral)   Resp 15   Ht 6\' 3"  (1.905 m)   Wt 59 kg   SpO2  95%   BMI 16.25 kg/m    Physical Exam  Constitutional: He is oriented to person, place, and time. He appears well-developed and well-nourished.  HENT:  Head: Normocephalic and atraumatic.  Eyes: Conjunctivae are normal.  Initially pinpoint, normal after narcan.   Neck: Neck supple.  Cardiovascular: Normal rate and regular rhythm.  Pulmonary/Chest: Effort normal and breath sounds normal. No respiratory distress.  Abdominal: Soft. There is no tenderness.  Musculoskeletal: He exhibits no edema.  Neurological: He is alert and  oriented to person, place, and time. No cranial nerve deficit. He exhibits normal muscle tone.  Initially somnolent but improved after narcan  Skin: Skin is warm and dry.  Psychiatric: He has a normal mood and affect.  Nursing note and vitals reviewed.    ED Treatments / Results  Labs (all labs ordered are listed, but only abnormal results are displayed) Labs Reviewed  ACETAMINOPHEN LEVEL - Abnormal; Notable for the following components:      Result Value   Acetaminophen (Tylenol), Serum <10 (*)    All other components within normal limits  COMPREHENSIVE METABOLIC PANEL - Abnormal; Notable for the following components:   Glucose, Bld 112 (*)    AST 98 (*)    ALT 153 (*)    All other components within normal limits  CBC WITH DIFFERENTIAL/PLATELET - Abnormal; Notable for the following components:   WBC 12.6 (*)    Neutro Abs 11.0 (*)    All other components within normal limits  RAPID URINE DRUG SCREEN, HOSP PERFORMED - Abnormal; Notable for the following components:   Opiates POSITIVE (*)    Cocaine POSITIVE (*)    Amphetamines POSITIVE (*)    Tetrahydrocannabinol POSITIVE (*)    All other components within normal limits  ETHANOL  SALICYLATE LEVEL  URINALYSIS, ROUTINE W REFLEX MICROSCOPIC    EKG None  Radiology No results found.  Procedures Procedures (including critical care time)  Medications Ordered in ED Medications  naloxone (NARCAN) 0.4 MG/ML injection (  Given 05/11/18 1631)     Initial Impression / Assessment and Plan / ED Course  I have reviewed the triage vital signs and the nursing notes.  Pertinent labs & imaging results that were available during my care of the patient were reviewed by me and considered in my medical decision making (see chart for details).     Patient is a 27 year old male with past medical history as detailed above who presents to the emergency department following a drug overdose.  Patient reportedly took heroin approximately  45 minutes prior to arrival to the emergency department.  With EMS he was alert and oriented x3 with intermittent stimulation needed.  They did not give Narcan as his respirations were maintained between 12 and 16 times per minute.  Upon arrival patient continues to be tachycardic but otherwise hemodynamically stable.  He is very sleepy on initial exam with pinpoint pupils and shallow respirations.   Secondary to patient's significant somnolence as well as his shallow respirations, 0.4 of Narcan was given intravenously with improvement to his mental status and his respiratory drive.  Labs were obtained during this visit and showed mildly elevated liver enzymes that are consistent with previous levels and UDS positive for opiates, amphetamines, cocaine, and THC.  Patient was observed in the emergency department for several hours.  Patient had no worsening of his mental status or recurrence of his diminished respiratory drive.  As a result patient is appropriate for discharge at this time.  His elevated  liver enzyme result was communicated to him and his mother and they were instructed to follow-up with a primary care provider for further work-up.  Patient was also given a prescription for Narcan to be used at home should there be concern for repeat overdose.  Patient in no acute distress at the time of discharge.  The care of this patient was discussed with my attending physician Dr. Jeraldine LootsLockwood, who voices agreement with work-up and ED disposition.  Final Clinical Impressions(s) / ED Diagnoses   Final diagnoses:  Accidental overdose of heroin, initial encounter [facility](HCC)    ED Discharge Orders         Ordered    naloxone [facility](NARCAN) nasal spray 4 mg/0.1 mL     05/11/18 2039          Keith Rakeozier, Landon Bassford T, MD 05/12/18 1258  Gerhard MunchLockwood, Robert, MD 05/14/18 1130

## 2018-05-11 NOTE — ED Notes (Signed)
Pt aware that urine sample is needed.  

## 2018-05-11 NOTE — Discharge Instructions (Signed)
Your liver enzymes were slightly elevated during this visit.  Please make your primary care physician aware of this finding and follow-up with them for further evaluation.

## 2018-05-11 NOTE — ED Notes (Addendum)
Pt provided with a coca cola at this time. Continues to complain of a headache.

## 2018-05-11 NOTE — ED Triage Notes (Signed)
Pt arrives to ED from the gym in a sitting area with complaints of overdosing on heroin this afternoon after his girlfriend broke up with him, had been clean for a year prior to today. EMS reports pt was not given narcan, is alert and oriented but presents high, injection site right forearm. Pt placed in position of comfort with bed locked and lowered, call bell in reach.

## 2019-02-17 ENCOUNTER — Other Ambulatory Visit: Payer: Self-pay

## 2019-02-17 ENCOUNTER — Emergency Department
Admission: EM | Admit: 2019-02-17 | Discharge: 2019-02-18 | Disposition: A | Discharge status: Home / Self Care | Payer: Self-pay | Attending: Emergency Medicine | Admitting: Emergency Medicine

## 2019-02-17 DIAGNOSIS — F1210 Cannabis abuse, uncomplicated: Secondary | ICD-10-CM | POA: Insufficient documentation

## 2019-02-17 DIAGNOSIS — J45909 Unspecified asthma, uncomplicated: Secondary | ICD-10-CM | POA: Insufficient documentation

## 2019-02-17 DIAGNOSIS — F1910 Other psychoactive substance abuse, uncomplicated: Secondary | ICD-10-CM | POA: Insufficient documentation

## 2019-02-17 DIAGNOSIS — F17210 Nicotine dependence, cigarettes, uncomplicated: Secondary | ICD-10-CM | POA: Insufficient documentation

## 2019-02-17 DIAGNOSIS — F1410 Cocaine abuse, uncomplicated: Secondary | ICD-10-CM | POA: Insufficient documentation

## 2019-02-17 DIAGNOSIS — T50901A Poisoning by unspecified drugs, medicaments and biological substances, accidental (unintentional), initial encounter: Secondary | ICD-10-CM | POA: Insufficient documentation

## 2019-02-17 LAB — COMPREHENSIVE METABOLIC PANEL
ALT: 35 U/L (ref 0–44)
AST: 37 U/L (ref 15–41)
Albumin: 4.1 g/dL (ref 3.5–5.0)
Alkaline Phosphatase: 96 U/L (ref 38–126)
Anion gap: 9 (ref 5–15)
BUN: 19 mg/dL (ref 6–20)
CO2: 28 mmol/L (ref 22–32)
Calcium: 9 mg/dL (ref 8.9–10.3)
Chloride: 103 mmol/L (ref 98–111)
Creatinine, Ser: 0.86 mg/dL (ref 0.61–1.24)
GFR calc Af Amer: >60 mL/min (ref >60)
GFR calc non Af Amer: >60 mL/min (ref >60)
Glucose, Bld: 105 mg/dL — ABNORMAL HIGH (ref 70–99)
Potassium: 3.7 mmol/L (ref 3.5–5.1)
Sodium: 140 mmol/L (ref 135–145)
Total Bilirubin: 0.6 mg/dL (ref 0.3–1.2)
Total Protein: 6.7 g/dL (ref 6.5–8.1)

## 2019-02-17 LAB — CBC WITH DIFFERENTIAL/PLATELET
Abs Immature Granulocytes: 0.03 10*3/uL (ref 0.00–0.07)
Basophils Absolute: 0 10*3/uL (ref 0.0–0.1)
Basophils Relative: 1 %
Eosinophils Absolute: 0.1 10*3/uL (ref 0.0–0.5)
Eosinophils Relative: 2 %
HCT: 38.9 % — ABNORMAL LOW (ref 39.0–52.0)
Hemoglobin: 13.1 g/dL (ref 13.0–17.0)
Immature Granulocytes: 1 %
Lymphocytes Relative: 29 %
Lymphs Abs: 1.5 10*3/uL (ref 0.7–4.0)
MCH: 31 pg (ref 26.0–34.0)
MCHC: 33.7 g/dL (ref 30.0–36.0)
MCV: 92.2 fL (ref 80.0–100.0)
Monocytes Absolute: 0.5 10*3/uL (ref 0.1–1.0)
Monocytes Relative: 9 %
Neutro Abs: 3.1 10*3/uL (ref 1.7–7.7)
Neutrophils Relative %: 58 %
Platelets: 216 10*3/uL (ref 150–400)
RBC: 4.22 MIL/uL (ref 4.22–5.81)
RDW: 12.3 % (ref 11.5–15.5)
WBC: 5.3 10*3/uL (ref 4.0–10.5)
nRBC: 0 % (ref 0.0–0.2)

## 2019-02-17 LAB — SALICYLATE LEVEL: Salicylate Lvl: <7 mg/dL (ref 2.8–30.0)

## 2019-02-17 LAB — ETHANOL: Alcohol, Ethyl (B): <10 mg/dL (ref <10)

## 2019-02-17 LAB — ACETAMINOPHEN LEVEL: Acetaminophen (Tylenol), Serum: <10 ug/mL — ABNORMAL LOW (ref 10–30)

## 2019-02-17 NOTE — ED Triage Notes (Signed)
Per [facility] pt found unconscious in parking lot, friend in car states they were using crystal meth. Pin point pupils.

## 2019-02-17 NOTE — ED Notes (Signed)
Pt on the phone at this time with his grandmother trying to obtain transportation at d/c.

## 2019-02-17 NOTE — ED Notes (Signed)
[place] alarm placed on stretcher. Yellow safety socks and yellow arm band placed on patient. Patient still sleeping at this time.

## 2019-02-17 NOTE — ED Notes (Signed)
Pt sleeping, unlabored breathing. Cardiac monitoring. Door to room open so pt easily visualized.

## 2019-02-17 NOTE — Discharge Instructions (Addendum)
We have provided resources for outpatient substance abuse counseling.  You should avoid drug use, make sure to eat regularly and drink plenty of fluids.  Return to the ER for any new or worsening symptoms that concern you.

## 2019-02-17 NOTE — ED Notes (Signed)
Scott at bedside at [employer]

## 2019-02-17 NOTE — ED Provider Notes (Signed)
[facility] Emergency Department Provider Note ____________________________________________   First MD Initiated Contact with Patient 02/17/19 1614     (approximate)  I have reviewed the triage vital signs and the nursing notes.   HISTORY  Chief Complaint Drug Overdose  Level 5 caveat: History of present illness limited due to drug intoxication  HPI Chad James is a 28 y.o. male with PMH as noted below including a history of polysubstance abuse who presents apparently intoxicated after he was found by bystanders outside of a store.  He was unconscious in the parking lot.  He was with a friend who reported that they were using crystal meth.  Per EMS, the patient was initially sedated appearing and had pinpoint pupils.  Once they transferred him he became very agitated but is now asleep again.  The patient is unable to give any history.  Past Medical History:  Diagnosis Date  . Asthma   . Depression   . Heroin abuse [facility])     Patient Active Problem List   Diagnosis Date Noted  . Accidental overdose of heroin ([place])   . Sepsis ([place]) 06/23/2016  . Aspiration pneumonia ([place]) 06/23/2016  . Acute encephalopathy 06/23/2016  . IVDU (intravenous drug user) 06/23/2016    History reviewed. No pertinent surgical history.  Prior to Admission medications   Medication Sig Start Date End Date Taking? Authorizing Provider  naloxone [facility]) nasal spray 4 mg/0.1 mL Please give this medication intranasally should there be concern for overdose. 05/11/18   Rozier, Chanda Busing, MD    Allergies Patient has no known allergies.  History reviewed. No pertinent family history.  Social History Social History   Tobacco Use  . Smoking status: Current Every Day Smoker    Packs/day: 1.00    Types: Cigarettes  . Smokeless tobacco: Never Used  Substance Use Topics  . Alcohol use: Yes    Comment: occ  . Drug use: Yes    Types: Marijuana, Cocaine, IV    Comment: IV  heroin use today 11/14    Review of Systems Level 5 caveat: Unable to obtain review of systems due to drug intoxication    ____________________________________________   PHYSICAL EXAM:  VITAL SIGNS: ED Triage Vitals  Enc Vitals Group     BP 02/17/19 1616 (!) 104/49     Pulse Rate 02/17/19 1616 68     Resp 02/17/19 1616 11     Temp --      Temp src --      SpO2 02/17/19 1616 96 %     Weight 02/17/19 1624 120 lb (54.4 kg)     Height --      Head Circumference --      Peak Flow --      Pain Score 02/17/19 1622 Asleep     Pain Loc --      Pain Edu? --      Excl. in [place]? --     Constitutional: Somnolent; when stimulated, becomes agitated.  Moaning intermittently. Eyes: Conjunctivae are normal.  EOMI.  PERRLA.  Pupils approximately 2 mm. Head: Atraumatic. Nose: No congestion/rhinnorhea. Mouth/Throat: Mucous membranes are moist.   Neck: Normal range of motion.  No midline spinal tenderness, step-off, or crepitus. Cardiovascular: Normal rate, regular rhythm. Grossly normal heart sounds.  Good peripheral circulation. Respiratory: Normal respiratory effort.  No retractions. Lungs CTAB. Gastrointestinal: Soft and nontender. No distention.  Genitourinary: No flank tenderness. Musculoskeletal: No lower extremity edema.  Extremities warm and well perfused.  Neurologic: Motor intact in all extremities. Skin:  Skin is warm and dry. No rash noted. Psychiatric: Unable to assess.  ____________________________________________   LABS (all labs ordered are listed, but only abnormal results are displayed)  Labs Reviewed  ACETAMINOPHEN LEVEL - Abnormal; Notable for the following components:      Result Value   Acetaminophen (Tylenol), Serum <10 (*)    All other components within normal limits  COMPREHENSIVE METABOLIC PANEL - Abnormal; Notable for the following components:   Glucose, Bld 105 (*)    All other components within normal limits  CBC WITH DIFFERENTIAL/PLATELET - Abnormal;  Notable for the following components:   HCT 38.9 (*)    All other components within normal limits  ETHANOL  SALICYLATE LEVEL  URINE DRUG SCREEN, QUALITATIVE ([facility] ONLY)   ____________________________________________  EKG   ____________________________________________  RADIOLOGY    ____________________________________________   PROCEDURES  Procedure(s) performed: No  Procedures  Critical Care performed: Yes  CRITICAL CARE Performed by: Dionne BucySebastian Kadi Hession   Total critical care time: 30 minutes  Critical care time was exclusive of separately billable procedures and treating other patients.  Critical care was necessary to treat or prevent imminent or life-threatening deterioration.  Critical care was time spent personally by me on the following activities: development of treatment plan with patient and/or surrogate as well as nursing, discussions with consultants, evaluation of patient's response to treatment, examination of patient, obtaining history from patient or surrogate, ordering and performing treatments and interventions, ordering and review of laboratory studies, ordering and review of radiographic studies, pulse oximetry and re-evaluation of patient's condition. ____________________________________________   INITIAL IMPRESSION / ASSESSMENT AND PLAN / ED COURSE  Pertinent labs & imaging results that were available during my care of the patient were reviewed by me and considered in my medical decision making (see chart for details).  28 year old male with a history of polysubstance abuse presents after an apparent drug overdose.  Per a bystander, the patient has been using crystal meth.  With EMS he alternated between being very somnolent and quite agitated.  He had good respiratory effort throughout their time with him.  I reviewed the past medical records in Epic.  In 2018 the [facility] with suicidal ideation and heroin abuse.   On exam today, the patient's vital signs are normal.  He is somnolent and when aroused starts to moan and become somewhat physically agitated although then falls back asleep.  He has a good respiratory effort.  Pupils are approximately 2 mm.  There is no visible trauma.  Neurologic exam is nonfocal.  Overall presentation is not consistent with a sympathomimetic toxidrome and I suspect likely a mixed intoxication.  We will obtain a lab work-up, UDS when able, and observe the patient.  At this time the known history is more consistent with intentional drug use and accidental overdose, rather than a suicide attempt.  When the patient is more awake I will reassess for acute psychiatric symptoms.  ----------------------------------------- 12:13 AM on 02/18/2019 -----------------------------------------  Patient is now awake, and oriented x4.  He endorses drug use earlier.  He remembers that he was with a friend and stopped at the deli where he was found.  The patient states he overdosed accidentally and was not trying to hurt himself.  He denies any SI or HI.  He has no other acute symptoms.  He feels comfortable to go home, but states he just needs a ride.  At this time, the  patient is stable for discharge.  I will provide substance abuse counseling resources.  Return precautions given, and he expressed understanding.  ____________________________________________   FINAL CLINICAL IMPRESSION(S) / ED DIAGNOSES  Final diagnoses:  Accidental drug overdose, initial encounter      NEW MEDICATIONS STARTED DURING THIS VISIT:  Discharge Medication List as of 02/17/2019 11:13 PM       Note:  This document was prepared using Dragon voice recognition software and may include unintentional dictation errors.   Dionne BucySiadecki, Kyler Lerette, MD 02/18/19 [phone]

## 2019-02-17 NOTE — ED Notes (Signed)
Pt sleeping with even, regular, and unlabored respirations; safety sitter remains at bedside.

## 2019-02-24 ENCOUNTER — Emergency Department: Admission: EM | Admit: 2019-02-24 | Discharge: 2019-02-24 | Discharge status: Absent without leave | Payer: Self-pay

## 2019-08-08 ENCOUNTER — Emergency Department: Payer: No Typology Code available for payment source

## 2019-08-08 ENCOUNTER — Emergency Department
Admission: EM | Admit: 2019-08-08 | Discharge: 2019-08-08 | Disposition: A | Discharge status: Home / Self Care | Payer: No Typology Code available for payment source | Attending: Emergency Medicine | Admitting: Emergency Medicine

## 2019-08-08 ENCOUNTER — Encounter: Payer: Self-pay | Admitting: Emergency Medicine

## 2019-08-08 ENCOUNTER — Other Ambulatory Visit: Payer: Self-pay

## 2019-08-08 DIAGNOSIS — R161 Splenomegaly, not elsewhere classified: Secondary | ICD-10-CM | POA: Diagnosis not present

## 2019-08-08 DIAGNOSIS — Y999 Unspecified external cause status: Secondary | ICD-10-CM | POA: Diagnosis not present

## 2019-08-08 DIAGNOSIS — R945 Abnormal results of liver function studies: Secondary | ICD-10-CM | POA: Insufficient documentation

## 2019-08-08 DIAGNOSIS — S99921A Unspecified injury of right foot, initial encounter: Secondary | ICD-10-CM | POA: Diagnosis present

## 2019-08-08 DIAGNOSIS — Y9389 Activity, other specified: Secondary | ICD-10-CM | POA: Insufficient documentation

## 2019-08-08 DIAGNOSIS — Z79899 Other long term (current) drug therapy: Secondary | ICD-10-CM | POA: Insufficient documentation

## 2019-08-08 DIAGNOSIS — J45909 Unspecified asthma, uncomplicated: Secondary | ICD-10-CM | POA: Insufficient documentation

## 2019-08-08 DIAGNOSIS — F17210 Nicotine dependence, cigarettes, uncomplicated: Secondary | ICD-10-CM | POA: Diagnosis not present

## 2019-08-08 DIAGNOSIS — R40241 Glasgow coma scale score 13-15, unspecified time: Secondary | ICD-10-CM | POA: Insufficient documentation

## 2019-08-08 DIAGNOSIS — S92341A Displaced fracture of fourth metatarsal bone, right foot, initial encounter for closed fracture: Secondary | ICD-10-CM | POA: Diagnosis not present

## 2019-08-08 DIAGNOSIS — R7989 Other specified abnormal findings of blood chemistry: Secondary | ICD-10-CM | POA: Diagnosis not present

## 2019-08-08 DIAGNOSIS — Z20822 Contact with and (suspected) exposure to covid-19: Secondary | ICD-10-CM | POA: Insufficient documentation

## 2019-08-08 DIAGNOSIS — Y92410 Unspecified street and highway as the place of occurrence of the external cause: Secondary | ICD-10-CM | POA: Insufficient documentation

## 2019-08-08 LAB — CBC WITH DIFFERENTIAL/PLATELET
Abs Immature Granulocytes: 0.02 10*3/uL (ref 0.00–0.07)
Basophils Absolute: 0 10*3/uL (ref 0.0–0.1)
Basophils Relative: 0 %
Eosinophils Absolute: 0 10*3/uL (ref 0.0–0.5)
Eosinophils Relative: 0 %
HCT: 39.3 % (ref 39.0–52.0)
Hemoglobin: 12.8 g/dL — ABNORMAL LOW (ref 13.0–17.0)
Immature Granulocytes: 0 %
Lymphocytes Relative: 15 %
Lymphs Abs: 1.1 10*3/uL (ref 0.7–4.0)
MCH: 28.1 pg (ref 26.0–34.0)
MCHC: 32.6 g/dL (ref 30.0–36.0)
MCV: 86.4 fL (ref 80.0–100.0)
Monocytes Absolute: 0.4 10*3/uL (ref 0.1–1.0)
Monocytes Relative: 6 %
Neutro Abs: 5.6 10*3/uL (ref 1.7–7.7)
Neutrophils Relative %: 79 %
Platelets: 187 10*3/uL (ref 150–400)
RBC: 4.55 MIL/uL (ref 4.22–5.81)
RDW: 13.3 % (ref 11.5–15.5)
WBC: 7.1 10*3/uL (ref 4.0–10.5)
nRBC: 0 % (ref 0.0–0.2)

## 2019-08-08 LAB — HEPATIC FUNCTION PANEL
ALT: 82 U/L — ABNORMAL HIGH (ref 0–44)
AST: 65 U/L — ABNORMAL HIGH (ref 15–41)
Albumin: 4.1 g/dL (ref 3.5–5.0)
Alkaline Phosphatase: 86 U/L (ref 38–126)
Bilirubin, Direct: 0.1 mg/dL (ref 0.0–0.2)
Indirect Bilirubin: 0.7 mg/dL (ref 0.3–0.9)
Total Bilirubin: 0.8 mg/dL (ref 0.3–1.2)
Total Protein: 7.4 g/dL (ref 6.5–8.1)

## 2019-08-08 LAB — BASIC METABOLIC PANEL
Anion gap: 10 (ref 5–15)
BUN: 18 mg/dL (ref 6–20)
CO2: 26 mmol/L (ref 22–32)
Calcium: 8.5 mg/dL — ABNORMAL LOW (ref 8.9–10.3)
Chloride: 98 mmol/L (ref 98–111)
Creatinine, Ser: 0.8 mg/dL (ref 0.61–1.24)
GFR calc Af Amer: >60 mL/min (ref >60)
GFR calc non Af Amer: >60 mL/min (ref >60)
Glucose, Bld: 103 mg/dL — ABNORMAL HIGH (ref 70–99)
Potassium: 4 mmol/L (ref 3.5–5.1)
Sodium: 134 mmol/L — ABNORMAL LOW (ref 135–145)

## 2019-08-08 LAB — LACTIC ACID, PLASMA: Lactic Acid, Venous: 1.2 mmol/L (ref 0.5–1.9)

## 2019-08-08 LAB — RESPIRATORY PANEL BY RT PCR (FLU A&B, COVID)
Influenza A by PCR: NEGATIVE
Influenza B by PCR: NEGATIVE
SARS Coronavirus 2 by RT PCR: NEGATIVE

## 2019-08-08 LAB — MONONUCLEOSIS SCREEN: Mono Screen: NEGATIVE

## 2019-08-08 LAB — PROTIME-INR
INR: 0.9 (ref 0.8–1.2)
Prothrombin Time: 12.4 seconds (ref 11.4–15.2)

## 2019-08-08 LAB — APTT: aPTT: 32 seconds (ref 24–36)

## 2019-08-08 LAB — ETHANOL: Alcohol, Ethyl (B): <10 mg/dL (ref <10)

## 2019-08-08 MED ORDER — TETANUS-DIPHTH-ACELL PERTUSSIS 5-2.5-18.5 LF-MCG/0.5 IM SUSP: 0.5000 mL | Freq: Once | INTRAMUSCULAR | Status: DC

## 2019-08-08 MED ORDER — SODIUM CHLORIDE 0.9 % IV BOLUS: 1000.0000 mL | Freq: Once | INTRAVENOUS | Status: DC

## 2019-08-08 MED ORDER — LACTATED RINGERS IV BOLUS: 1000.0000 mL | Freq: Once | INTRAVENOUS | Status: DC

## 2019-08-08 MED ORDER — IOHEXOL 300 MG/ML  SOLN
100.0000 mL | Freq: Once | INTRAMUSCULAR | Status: AC | PRN
  Administered 2019-08-08: 20:00:00 100 mL via INTRAVENOUS
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
## 2019-08-08 NOTE — ED Notes (Signed)
MD at the bedside  

## 2019-08-08 NOTE — Discharge Instructions (Addendum)
  For the foot fracture he should be weightbearing as tolerated.  Take Tylenol 1 g every 8 hours to help with pain.  You can take ibuprofen 600 every 6 hours as well.  You should follow-up with your primary care doctor for your enlarged spleen.  We are testing you for mono.  You should avoid contact sports for the next 6 weeks.  Your liver function is also elevated at you will have to have this followed up with your primary doctor.  Return to the ER for fevers or any other concerns   IMPRESSION: 1. Small mildly displaced fracture at the head of the fourth metacarpal. 2. Subtle linear lucency at the head of the third metacarpal which likely represents a vascular channel, less likely a nondisplaced fracture No traumatic finding.   Early emphysematous changes at the lung apices, premature for age.   Splenomegaly, with volume approximately 1000 cc. Most commonly at  this age this would be reactive to a systemic viral infection such  as mononucleosis. Nonspecific however.
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
## 2019-08-08 NOTE — ED Notes (Signed)
Pt out in hallway. States he is wanting to know where the exit is located. Pt states he wants to go home.

## 2019-08-08 NOTE — ED Notes (Signed)
Pt to CT

## 2019-08-13 LAB — CULTURE, BLOOD (ROUTINE X 2)
Culture: NO GROWTH
Culture: NO GROWTH

## 2020-04-14 ENCOUNTER — Emergency Department: Payer: Self-pay

## 2020-04-14 ENCOUNTER — Other Ambulatory Visit: Payer: Self-pay

## 2020-04-14 ENCOUNTER — Emergency Department
Admission: EM | Admit: 2020-04-14 | Discharge: 2020-04-14 | Disposition: A | Discharge status: Home / Self Care | Payer: Self-pay | Attending: Emergency Medicine | Admitting: Emergency Medicine

## 2020-04-14 DIAGNOSIS — F1510 Other stimulant abuse, uncomplicated: Secondary | ICD-10-CM

## 2020-04-14 DIAGNOSIS — F15251 Other stimulant dependence with stimulant-induced psychotic disorder with hallucinations: Secondary | ICD-10-CM | POA: Insufficient documentation

## 2020-04-14 DIAGNOSIS — Z20822 Contact with and (suspected) exposure to covid-19: Secondary | ICD-10-CM | POA: Insufficient documentation

## 2020-04-14 DIAGNOSIS — F15950 Other stimulant use, unspecified with stimulant-induced psychotic disorder with delusions: Secondary | ICD-10-CM

## 2020-04-14 DIAGNOSIS — M25531 Pain in right wrist: Secondary | ICD-10-CM | POA: Insufficient documentation

## 2020-04-14 DIAGNOSIS — F329 Major depressive disorder, single episode, unspecified: Secondary | ICD-10-CM | POA: Insufficient documentation

## 2020-04-14 DIAGNOSIS — J029 Acute pharyngitis, unspecified: Secondary | ICD-10-CM | POA: Insufficient documentation

## 2020-04-14 DIAGNOSIS — F1110 Opioid abuse, uncomplicated: Secondary | ICD-10-CM | POA: Insufficient documentation

## 2020-04-14 DIAGNOSIS — F29 Unspecified psychosis not due to a substance or known physiological condition: Secondary | ICD-10-CM

## 2020-04-14 DIAGNOSIS — J45909 Unspecified asthma, uncomplicated: Secondary | ICD-10-CM | POA: Insufficient documentation

## 2020-04-14 DIAGNOSIS — F17210 Nicotine dependence, cigarettes, uncomplicated: Secondary | ICD-10-CM | POA: Insufficient documentation

## 2020-04-14 LAB — URINE DRUG SCREEN, QUALITATIVE (ARMC ONLY)
Amphetamines, Ur Screen: POSITIVE — AB
Barbiturates, Ur Screen: NOT DETECTED
Benzodiazepine, Ur Scrn: NOT DETECTED
Cannabinoid 50 Ng, Ur ~~LOC~~: NOT DETECTED
Cocaine Metabolite,Ur ~~LOC~~: NOT DETECTED
MDMA (Ecstasy)Ur Screen: NOT DETECTED
Methadone Scn, Ur: NOT DETECTED
Opiate, Ur Screen: POSITIVE — AB
Phencyclidine (PCP) Ur S: NOT DETECTED
Tricyclic, Ur Screen: NOT DETECTED

## 2020-04-14 LAB — URINALYSIS, COMPLETE (UACMP) WITH MICROSCOPIC
Bacteria, UA: NONE SEEN
Bilirubin Urine: NEGATIVE
Glucose, UA: NEGATIVE mg/dL
Hgb urine dipstick: NEGATIVE
Ketones, ur: 20 mg/dL — AB
Leukocytes,Ua: NEGATIVE
Nitrite: NEGATIVE
Protein, ur: NEGATIVE mg/dL
Specific Gravity, Urine: 1.018 (ref 1.005–1.030)
Squamous Epithelial / HPF: NONE SEEN (ref 0–5)
pH: 6 (ref 5.0–8.0)

## 2020-04-14 LAB — CBC
HCT: 35.9 % — ABNORMAL LOW (ref 39.0–52.0)
Hemoglobin: 12 g/dL — ABNORMAL LOW (ref 13.0–17.0)
MCH: 27 pg (ref 26.0–34.0)
MCHC: 33.4 g/dL (ref 30.0–36.0)
MCV: 80.7 fL (ref 80.0–100.0)
Platelets: 190 10*3/uL (ref 150–400)
RBC: 4.45 MIL/uL (ref 4.22–5.81)
RDW: 15.4 % (ref 11.5–15.5)
WBC: 10 10*3/uL (ref 4.0–10.5)
nRBC: 0 % (ref 0.0–0.2)

## 2020-04-14 LAB — COMPREHENSIVE METABOLIC PANEL
ALT: 96 U/L — ABNORMAL HIGH (ref 0–44)
AST: 138 U/L — ABNORMAL HIGH (ref 15–41)
Albumin: 4.6 g/dL (ref 3.5–5.0)
Alkaline Phosphatase: 99 U/L (ref 38–126)
Anion gap: 14 (ref 5–15)
BUN: 30 mg/dL — ABNORMAL HIGH (ref 6–20)
CO2: 24 mmol/L (ref 22–32)
Calcium: 8.9 mg/dL (ref 8.9–10.3)
Chloride: 97 mmol/L — ABNORMAL LOW (ref 98–111)
Creatinine, Ser: 1.19 mg/dL (ref 0.61–1.24)
GFR, Estimated: >60 mL/min (ref >60)
Glucose, Bld: 97 mg/dL (ref 70–99)
Potassium: 3.3 mmol/L — ABNORMAL LOW (ref 3.5–5.1)
Sodium: 135 mmol/L (ref 135–145)
Total Bilirubin: 1.1 mg/dL (ref 0.3–1.2)
Total Protein: 7.7 g/dL (ref 6.5–8.1)

## 2020-04-14 LAB — RESPIRATORY PANEL BY RT PCR (FLU A&B, COVID)
Influenza A by PCR: NEGATIVE
Influenza B by PCR: NEGATIVE
SARS Coronavirus 2 by RT PCR: NEGATIVE

## 2020-04-14 LAB — GROUP A STREP BY PCR: Group A Strep by PCR: NOT DETECTED

## 2020-04-14 LAB — ETHANOL: Alcohol, Ethyl (B): <10 mg/dL (ref <10)

## 2020-04-14 LAB — HIV ANTIBODY (ROUTINE TESTING W REFLEX): HIV Screen 4th Generation wRfx: NONREACTIVE

## 2020-04-14 LAB — AMMONIA: Ammonia: 28 umol/L (ref 9–35)

## 2020-04-14 LAB — LACTIC ACID, PLASMA: Lactic Acid, Venous: 1.2 mmol/L (ref 0.5–1.9)

## 2020-04-14 LAB — SALICYLATE LEVEL: Salicylate Lvl: <7 mg/dL — ABNORMAL LOW (ref 7.0–30.0)

## 2020-04-14 LAB — MONONUCLEOSIS SCREEN: Mono Screen: NEGATIVE

## 2020-04-14 LAB — ACETAMINOPHEN LEVEL: Acetaminophen (Tylenol), Serum: <10 ug/mL — ABNORMAL LOW (ref 10–30)

## 2020-04-14 MED ORDER — ACETAMINOPHEN 325 MG PO TABS
650.0000 mg | ORAL_TABLET | Freq: Once | ORAL | Status: AC | PRN
  Administered 2020-04-14: 650 mg via ORAL
  Filled 2020-04-14: qty 2

## 2020-04-14 MED ORDER — AMOXICILLIN 500 MG PO CAPS
500.0000 mg | ORAL_CAPSULE | Freq: Three times a day (TID) | ORAL | Status: DC
  Administered 2020-04-14 (×2): 500 mg via ORAL
  Filled 2020-04-14 (×5): qty 1

## 2020-04-14 MED ORDER — TETANUS-DIPHTH-ACELL PERTUSSIS 5-2.5-18.5 LF-MCG/0.5 IM SUSP
0.5000 mL | Freq: Once | INTRAMUSCULAR | Status: DC
  Filled 2020-04-14: qty 0.5

## 2020-04-14 NOTE — ED Notes (Signed)
Madie Reno [phone] and [phone] numbers if needed.
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
## 2020-04-14 NOTE — ED Notes (Signed)
Pt. Transferred from Triage to room 23 after dressing out and screening for contraband. Report to include Situation, Background, Assessment and Recommendations from triage RN. Pt. Oriented to Quad including Q15 minute rounds as well as Psychologist, counselling for their protection. Patient is alert and oriented, warm and dry in no acute distress. Patient denies SI, HI, and AVH. Pt. Encouraged to let me know if needs arise.
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
## 2020-04-14 NOTE — BH Assessment (Signed)
TTS attempted to complete reassessment at 11:30am but was unsuccessful due to pt sleeping and being unable to be aroused.

## 2020-04-14 NOTE — ED Notes (Signed)
Pt transferred into ED BHU room 2    Patient assigned to appropriate care area. Patient oriented to unit/care area: Informed that, for his safety, care areas are designed for safety and monitored by security cameras at all times; Visiting hours and phone times explained to patient. Patient verbalizes understanding, and verbal contract for safety obtained.  Assessment completed  He denies pain     
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
## 2020-04-14 NOTE — ED Provider Notes (Signed)
Procedures   ----------------------------------------- 4:37 PM on 04/14/2020 ----------------------------------------- Case discussed with psychiatry Dr. 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  Patient is now lucid and calm cooperative, not psychotic and without SI or HI.  Stable for discharge, referral to [facility] outpatient therapy.  Final diagnoses:  Psychosis, unspecified psychosis type (HCC)  Pharyngitis, unspecified etiology  Methamphetamine abuse (HCC)       Sharman Cheek, MD 04/14/20 [phone]
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
## 2020-04-14 NOTE — ED Notes (Signed)
Hourly rounding reveals patient in room. No complaints, stable, in no acute distress. Q15 minute rounds and monitoring via Rover and Officer to continue.   

## 2020-04-14 NOTE — ED Notes (Signed)
Patient transferred to room 2 in the BHU from the quad, He transferred via w/c, notified Amy T Rn that will be resuming care.

## 2020-04-14 NOTE — ED Notes (Signed)
Asked pt to give [place] a urine sample, pt states he does not have to urinate. Reported to RN Toniann Fail before transporting to [facility].

## 2020-04-14 NOTE — ED Notes (Addendum)
Patient changed into hospital provided scrubs by this RN and by Selena Batten NT. Patient's belonging's placed into belonging's bag. Patient's belonging's include:  Turquoise shirt, pair shoes, tan shorts, black belt, boxers, black wallet, small round plastic container with lid, screw driver.

## 2020-04-14 NOTE — BH Assessment (Signed)
Per Dr. Toni Amend request, TTS provided pt OPT resources. Pt agreed to follow up with [facility] upon discharge.
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
## 2020-04-15 ENCOUNTER — Telehealth: Payer: Self-pay | Admitting: Emergency Medicine

## 2020-04-15 LAB — HEPATITIS PANEL, ACUTE
HCV Ab: REACTIVE — AB
Hep A IgM: NONREACTIVE
Hep B C IgM: NONREACTIVE
Hepatitis B Surface Ag: NONREACTIVE

## 2020-04-15 NOTE — Telephone Encounter (Signed)
Called patient toi nform of hep c test results and need for further testing.  No answer and no voicemail.

## 2020-04-19 LAB — CULTURE, BLOOD (ROUTINE X 2)
Culture: NO GROWTH
Culture: NO GROWTH
Special Requests: ADEQUATE
Special Requests: ADEQUATE

## 2020-04-22 NOTE — Telephone Encounter (Signed)
Tried to call patient again with no answer and no voicemail.

## 2021-01-18 IMAGING — CR DG HAND 2V*R*
2 series · 2 of 2 positions shown · non-contrast
Comparison: None

CLINICAL DATA: Fell out of bed injuring RIGHT arm, wrist pain

EXAM:
RIGHT HAND - 2 VIEW

[hand ap]
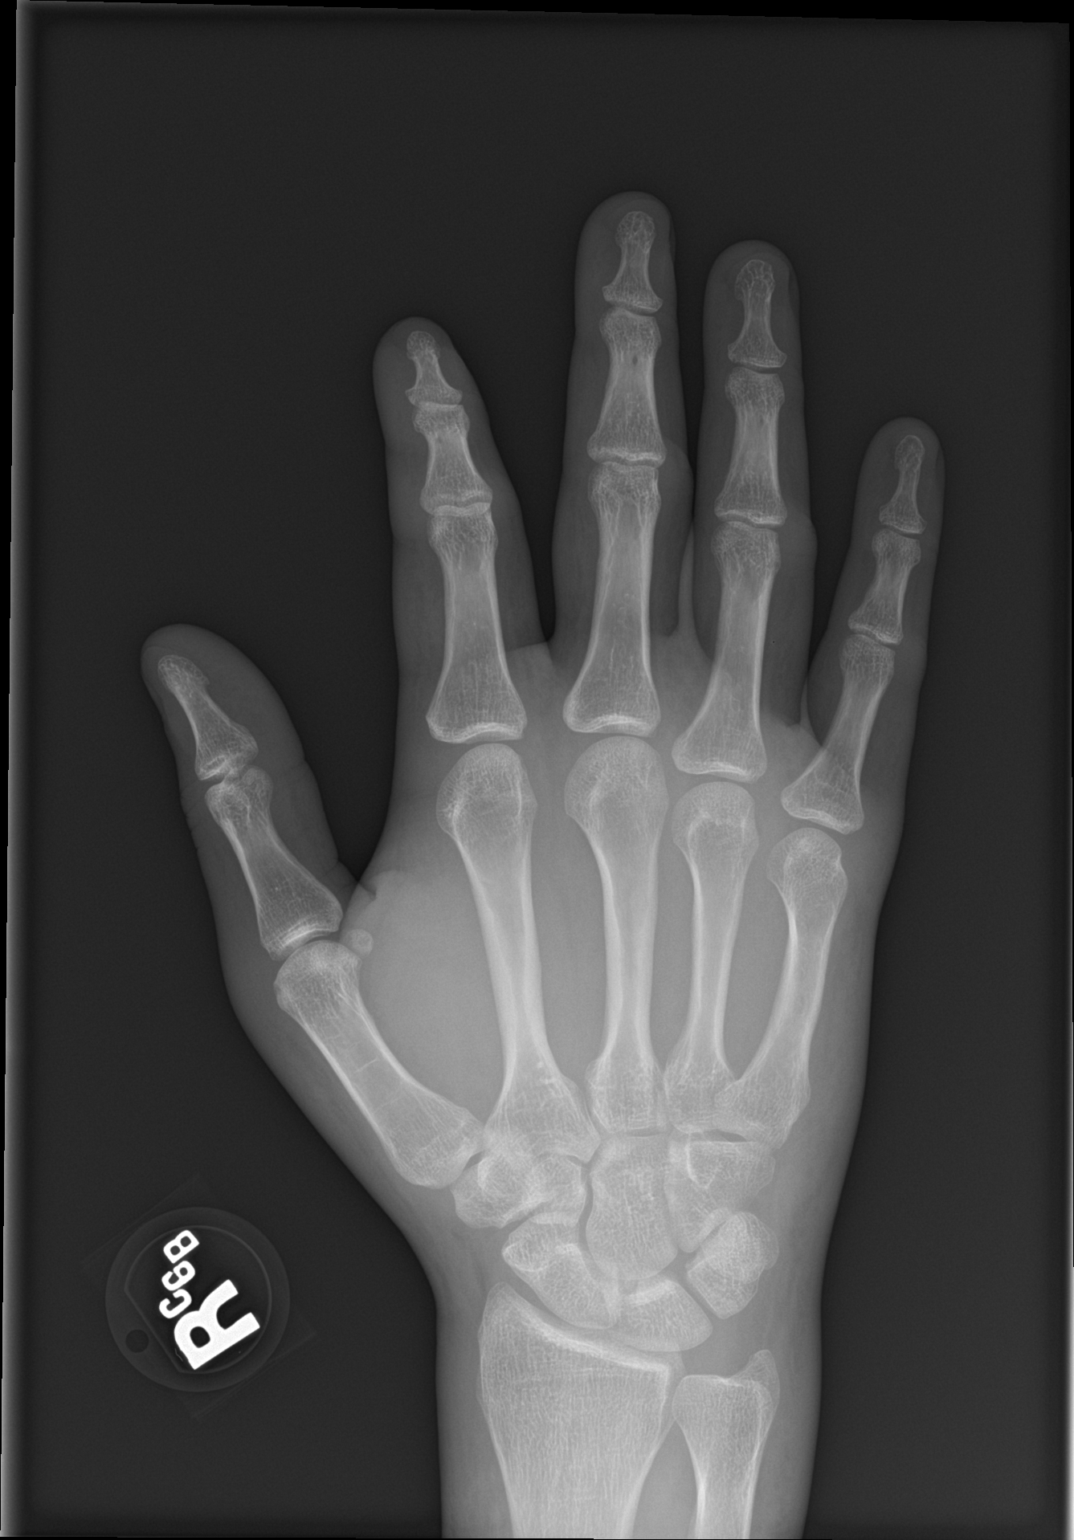

[hand lat]
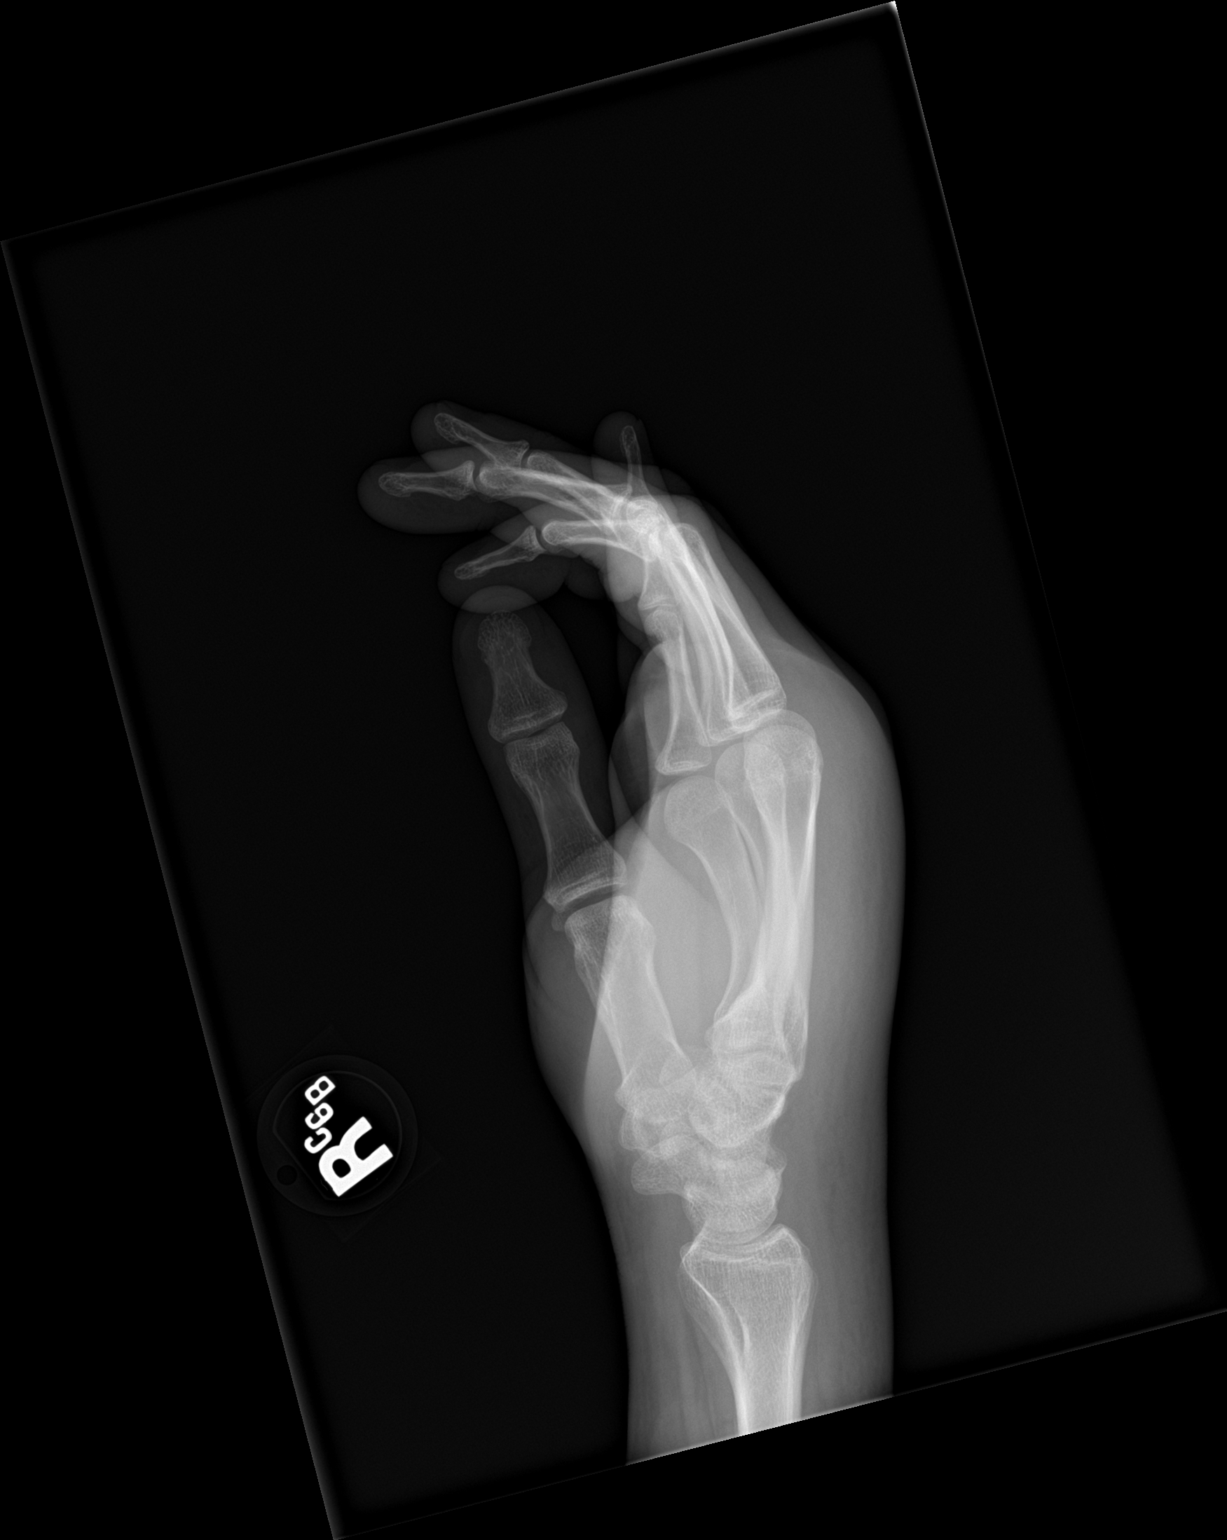

[2 of 2 positions shown; findings below may reference images not displayed]

FINDINGS: Diffuse soft tissue swelling of RIGHT hand and wrist into forearm
especially dorsally.

Osseous mineralization normal.

Joint spaces preserved.

No acute fracture, dislocation, or bone destruction.
IMPRESSION: Significant soft tissue swelling without acute osseous
abnormalities.

## 2021-01-18 IMAGING — CR DG WRIST COMPLETE 3+V*R*
1 series · 4 of 4 positions shown · non-contrast
Comparison: None.

CLINICAL DATA: 29-year-old male with fall and right wrist pain.

EXAM:
RIGHT WRIST - COMPLETE 3+ VIEW

[Series 1: dg wrist complete right · 0.14mm/px · 4 of 4 slices shown]
[im 1/4]
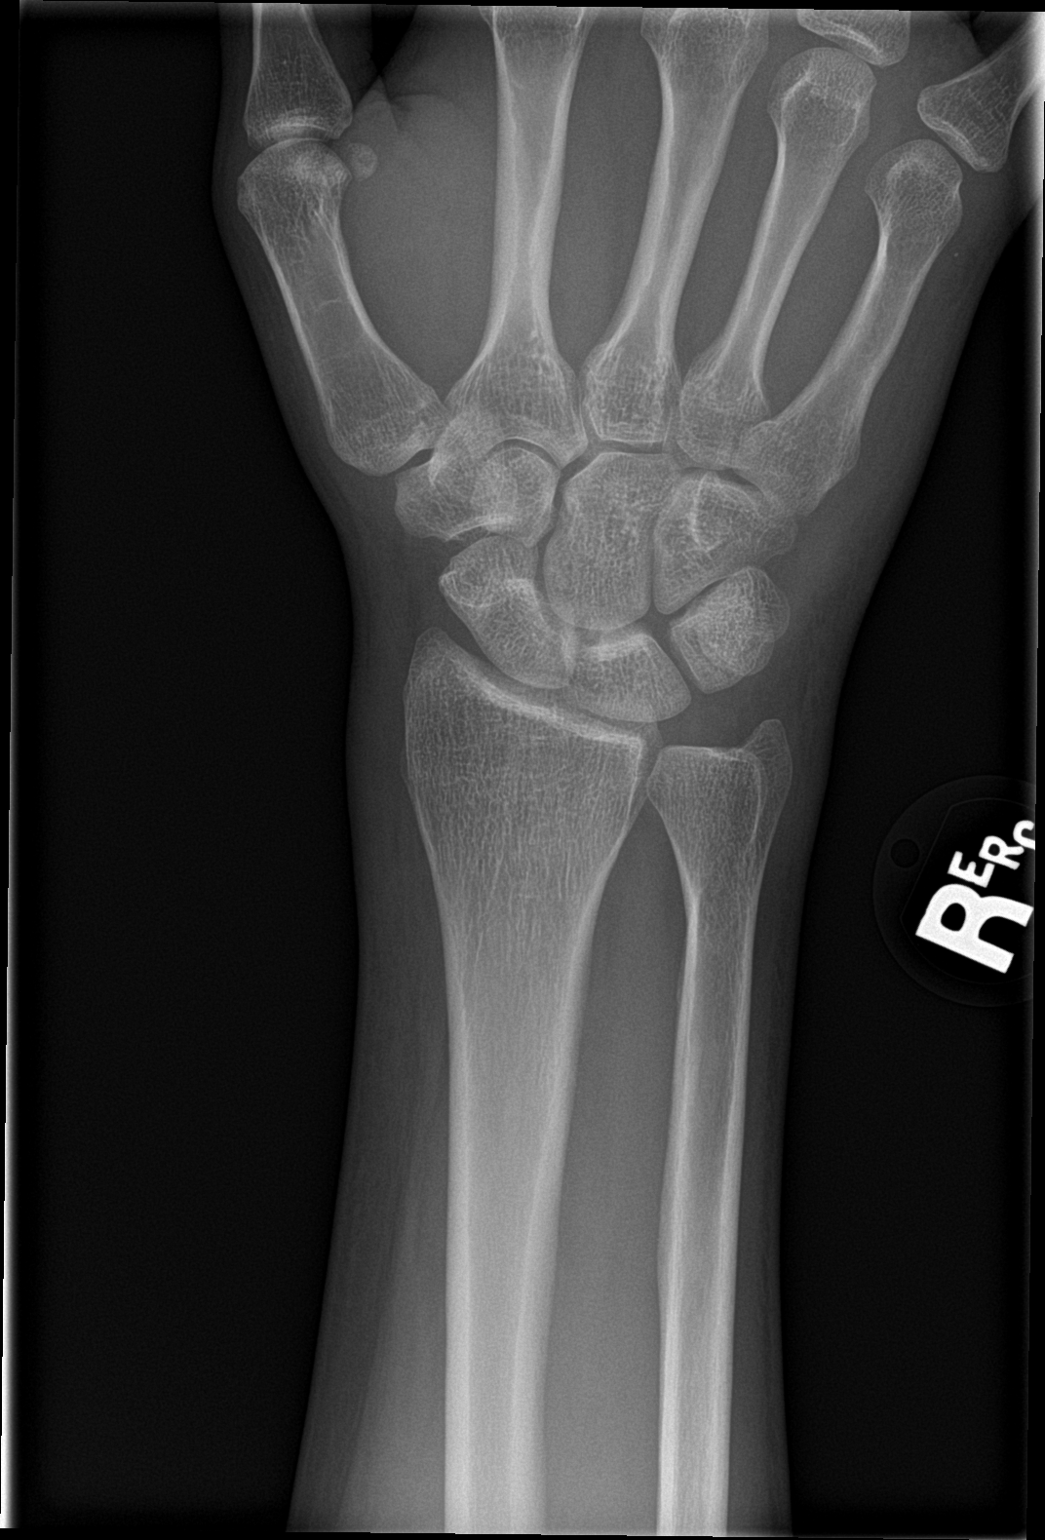
[im 2/4]
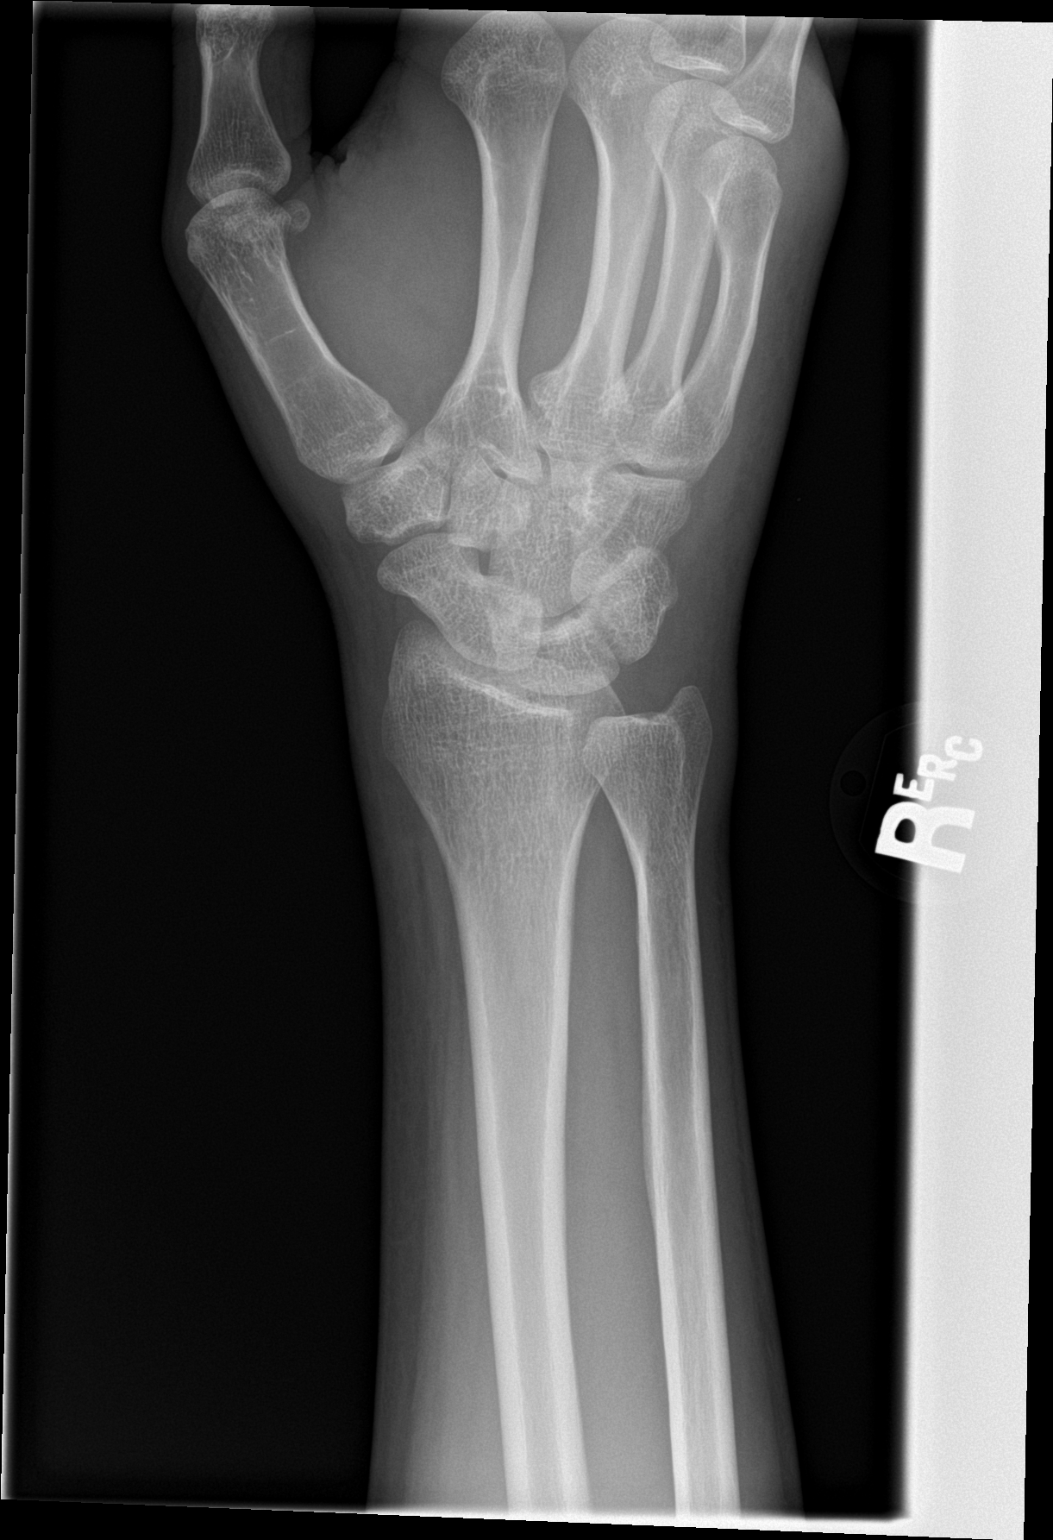
[im 3/4]
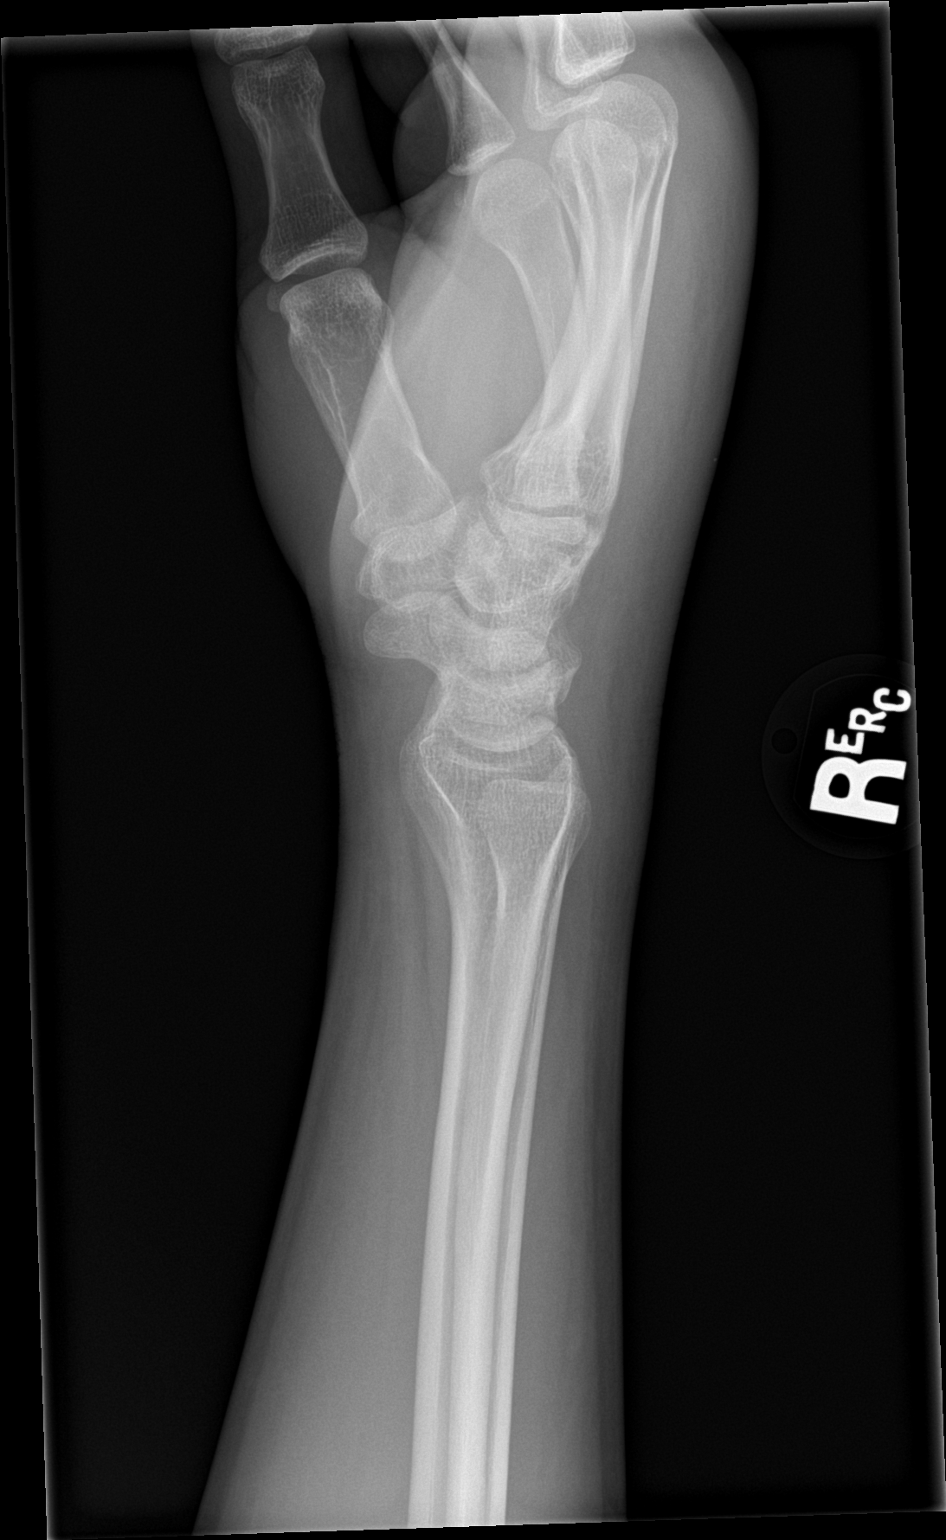
[im 4/4]
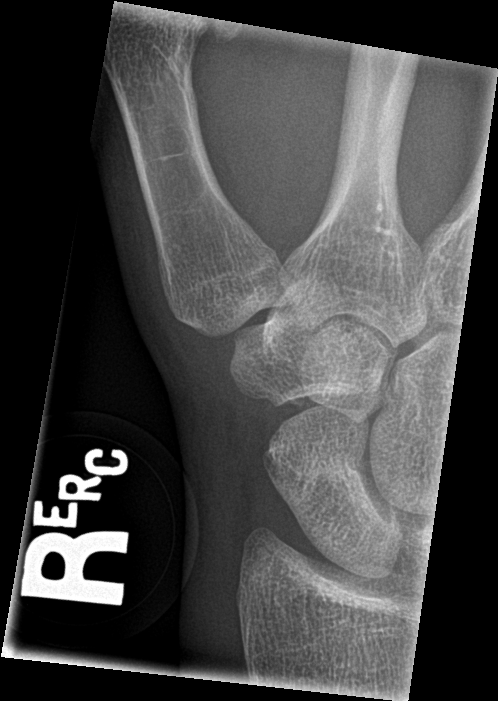

[4 of 4 positions shown; findings below may reference images not displayed]

FINDINGS: There is no acute fracture or dislocation. The bones are well
mineralized. No arthritic changes. There is soft tissue swelling of
the dorsum of the hand which may represent cellulitis. Clinical
correlation is recommended. No radiopaque foreign object or soft
tissue gas.
IMPRESSION: 1. No acute fracture or dislocation.
2. Soft tissue swelling of the dorsum of the hand.

## 2022-10-19 ENCOUNTER — Inpatient Hospital Stay (HOSPITAL_COMMUNITY)
Admission: EM | Admit: 2022-10-19 | Discharge: 2022-10-26 | DRG: 871 | Disposition: A | Discharge status: Home / Self Care | Payer: Medicaid Other | Attending: Internal Medicine | Admitting: Internal Medicine

## 2022-10-19 ENCOUNTER — Emergency Department (HOSPITAL_COMMUNITY): Payer: Medicaid Other

## 2022-10-19 ENCOUNTER — Encounter (HOSPITAL_COMMUNITY): Payer: Self-pay

## 2022-10-19 ENCOUNTER — Other Ambulatory Visit: Payer: Self-pay

## 2022-10-19 DIAGNOSIS — G928 Other toxic encephalopathy: Secondary | ICD-10-CM | POA: Diagnosis present

## 2022-10-19 DIAGNOSIS — R579 Shock, unspecified: Secondary | ICD-10-CM | POA: Diagnosis present

## 2022-10-19 DIAGNOSIS — Z79899 Other long term (current) drug therapy: Secondary | ICD-10-CM

## 2022-10-19 DIAGNOSIS — F1193 Opioid use, unspecified with withdrawal: Principal | ICD-10-CM

## 2022-10-19 DIAGNOSIS — F1410 Cocaine abuse, uncomplicated: Secondary | ICD-10-CM | POA: Diagnosis present

## 2022-10-19 DIAGNOSIS — J45909 Unspecified asthma, uncomplicated: Secondary | ICD-10-CM | POA: Diagnosis present

## 2022-10-19 DIAGNOSIS — E876 Hypokalemia: Secondary | ICD-10-CM | POA: Diagnosis present

## 2022-10-19 DIAGNOSIS — I10 Essential (primary) hypertension: Secondary | ICD-10-CM | POA: Diagnosis present

## 2022-10-19 DIAGNOSIS — A419 Sepsis, unspecified organism: Principal | ICD-10-CM | POA: Diagnosis present

## 2022-10-19 DIAGNOSIS — F1123 Opioid dependence with withdrawal: Secondary | ICD-10-CM | POA: Diagnosis present

## 2022-10-19 DIAGNOSIS — R6521 Severe sepsis with septic shock: Secondary | ICD-10-CM | POA: Diagnosis present

## 2022-10-19 DIAGNOSIS — N179 Acute kidney failure, unspecified: Secondary | ICD-10-CM | POA: Diagnosis present

## 2022-10-19 DIAGNOSIS — F419 Anxiety disorder, unspecified: Secondary | ICD-10-CM | POA: Diagnosis present

## 2022-10-19 DIAGNOSIS — R571 Hypovolemic shock: Secondary | ICD-10-CM | POA: Diagnosis present

## 2022-10-19 DIAGNOSIS — E872 Acidosis, unspecified: Secondary | ICD-10-CM | POA: Diagnosis present

## 2022-10-19 DIAGNOSIS — F32A Depression, unspecified: Secondary | ICD-10-CM | POA: Diagnosis present

## 2022-10-19 DIAGNOSIS — F17210 Nicotine dependence, cigarettes, uncomplicated: Secondary | ICD-10-CM | POA: Diagnosis present

## 2022-10-19 DIAGNOSIS — D696 Thrombocytopenia, unspecified: Secondary | ICD-10-CM | POA: Diagnosis present

## 2022-10-19 DIAGNOSIS — E860 Dehydration: Secondary | ICD-10-CM | POA: Diagnosis present

## 2022-10-19 LAB — COMPREHENSIVE METABOLIC PANEL
ALT: 54 U/L — ABNORMAL HIGH (ref 0–44)
AST: 54 U/L — ABNORMAL HIGH (ref 15–41)
Albumin: 3.7 g/dL (ref 3.5–5.0)
Alkaline Phosphatase: 119 U/L (ref 38–126)
Anion gap: 14 (ref 5–15)
BUN: 15 mg/dL (ref 6–20)
CO2: 22 mmol/L (ref 22–32)
Calcium: 8.9 mg/dL (ref 8.9–10.3)
Chloride: 99 mmol/L (ref 98–111)
Creatinine, Ser: 1.53 mg/dL — ABNORMAL HIGH (ref 0.61–1.24)
GFR, Estimated: >60 mL/min (ref >60)
Glucose, Bld: 100 mg/dL — ABNORMAL HIGH (ref 70–99)
Potassium: 3.3 mmol/L — ABNORMAL LOW (ref 3.5–5.1)
Sodium: 135 mmol/L (ref 135–145)
Total Bilirubin: 2.2 mg/dL — ABNORMAL HIGH (ref 0.3–1.2)
Total Protein: 6.8 g/dL (ref 6.5–8.1)

## 2022-10-19 LAB — CBC WITH DIFFERENTIAL/PLATELET
Abs Immature Granulocytes: 0 10*3/uL (ref 0.00–0.07)
Basophils Absolute: 0 10*3/uL (ref 0.0–0.1)
Basophils Relative: 1 %
Eosinophils Absolute: 0 10*3/uL (ref 0.0–0.5)
Eosinophils Relative: 0 %
HCT: 38.1 % — ABNORMAL LOW (ref 39.0–52.0)
Hemoglobin: 12.3 g/dL — ABNORMAL LOW (ref 13.0–17.0)
Immature Granulocytes: 0 %
Lymphocytes Relative: 7 %
Lymphs Abs: 0.2 10*3/uL — ABNORMAL LOW (ref 0.7–4.0)
MCH: 27.4 pg (ref 26.0–34.0)
MCHC: 32.3 g/dL (ref 30.0–36.0)
MCV: 84.9 fL (ref 80.0–100.0)
Monocytes Absolute: 0 10*3/uL — ABNORMAL LOW (ref 0.1–1.0)
Monocytes Relative: 1 %
Neutro Abs: 3.1 10*3/uL (ref 1.7–7.7)
Neutrophils Relative %: 91 %
Platelets: 136 10*3/uL — ABNORMAL LOW (ref 150–400)
RBC: 4.49 MIL/uL (ref 4.22–5.81)
RDW: 13.5 % (ref 11.5–15.5)
WBC Morphology: INCREASED
WBC: 3.4 10*3/uL — ABNORMAL LOW (ref 4.0–10.5)
nRBC: 0 % (ref 0.0–0.2)
nRBC: 0 /100 WBC

## 2022-10-19 LAB — ACETAMINOPHEN LEVEL: Acetaminophen (Tylenol), Serum: <10 ug/mL — ABNORMAL LOW (ref 10–30)

## 2022-10-19 LAB — CBG MONITORING, ED: Glucose-Capillary: 98 mg/dL (ref 70–99)

## 2022-10-19 LAB — ETHANOL: Alcohol, Ethyl (B): <10 mg/dL (ref <10)

## 2022-10-19 LAB — SALICYLATE LEVEL: Salicylate Lvl: <7 mg/dL — ABNORMAL LOW (ref 7.0–30.0)

## 2022-10-19 MED ORDER — SODIUM CHLORIDE 0.9 % IV BOLUS
1000.0000 mL | Freq: Once | INTRAVENOUS | Status: AC
  Administered 2022-10-19: 1000 mL via INTRAVENOUS

## 2022-10-19 MED ORDER — LACTATED RINGERS IV BOLUS
2000.0000 mL | Freq: Once | INTRAVENOUS | Status: AC
  Administered 2022-10-19: 2000 mL via INTRAVENOUS

## 2022-10-19 MED ORDER — ONDANSETRON 4 MG PO TBDP
4.0000 mg | ORAL_TABLET | Freq: Once | ORAL | Status: AC
  Administered 2022-10-19: 4 mg via ORAL
  Filled 2022-10-19: qty 1

## 2022-10-19 MED ORDER — LACTATED RINGERS IV BOLUS: 1000.0000 mL | Freq: Once | INTRAVENOUS | Status: DC

## 2022-10-19 NOTE — ED Provider Notes (Signed)
[facility]ERG[facility] DEPARTMENT AT [facility] Provider Note  CSN: [id number] Arrival date & time: 10/19/22 1850  Chief Complaint(s) Drug Problem  HPI Chad James is a 32 y.o. 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  Patient took Suboxone for the first time today, he took 4 mg dose of Suboxone, shortly after taking this he began to have abdominal cramping, profuse diarrhea and vomiting.  Felt very nauseated.  When EMS arrived at his vehicle where he was using the medication there was numerous times of drug paraphernalia in the vehicle with him.  He was agitated but redirectable.  Did not verbalize suicidal homicidal thoughts.  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 Authorizing Provider  naloxone [facility]) nasal spray 4 mg/0.1 mL Please give this medication intranasally should there be concern for overdose. 05/11/18   Rozier, Winfield Rast, MD                                                                                                                                    Past Surgical History History reviewed. No pertinent surgical history. Family History History reviewed. No pertinent family history.  Social History Social History   Tobacco Use   Smoking status:  Every Day    Packs/day: 1    Types: Cigarettes   Smokeless tobacco: Never  Substance Use Topics   Alcohol use: Yes    Comment: occ   Drug use: Yes    Types: Marijuana, Cocaine, IV    Comment: IV heroin use today 11/14   Allergies Patient has no known allergies.  Review of Systems Review of Systems  Unable to perform ROS: Mental status change  Constitutional:  Positive for diaphoresis.  Gastrointestinal:  Positive for abdominal pain, diarrhea, nausea and vomiting.    Physical Exam Vital Signs  I have reviewed the triage vital signs BP (!) 90/58 (BP Location: Right Arm)   Pulse (!) 127   Temp 99.6 F (37.6 C)   Resp (!) 29   Ht 6\' 3"  (1.905 m)   Wt 74.8 kg   SpO2 98%   BMI 20.61 kg/m  Physical Exam Vitals and nursing note reviewed. Exam conducted with a chaperone present.  Constitutional:  Appearance: He is well-developed. He is ill-appearing and diaphoretic.     Comments: Stool and vomit on clothes  HENT:     Head: Normocephalic and atraumatic.     Comments: No external evidence of head trauma    Right Ear: External ear normal.     Left Ear: External ear normal.     Mouth/Throat:     Mouth: Mucous membranes are moist.  Eyes:     General: No scleral icterus. Cardiovascular:     Rate and Rhythm: Normal rate and regular rhythm.     Pulses: Normal pulses.     Heart sounds: Normal heart sounds.  Pulmonary:     Effort: Pulmonary effort is normal. No respiratory distress.     Breath sounds: Normal breath sounds.  Abdominal:     General: Abdomen is flat.     Palpations: Abdomen is soft.     Tenderness: There is no abdominal tenderness. There is no guarding or rebound.  Musculoskeletal:     Right lower leg: No edema.     Left lower leg: No edema.  Skin:    General: Skin is warm.  Neurological:     Mental Status: He is alert. He is confused.     GCS: GCS eye subscore is 4. GCS verbal subscore is 4. GCS motor subscore is 6.  Psychiatric:        Mood and  Affect: Mood normal.        Behavior: Behavior normal.     ED Results and Treatments Labs (all labs ordered are listed, but only abnormal results are displayed) Labs Reviewed  COMPREHENSIVE METABOLIC PANEL - Abnormal; Notable for the following components:      Result Value   Potassium 3.3 (*)    Glucose, Bld 100 (*)    Creatinine, Ser 1.53 (*)    AST 54 (*)    ALT 54 (*)    Total Bilirubin 2.2 (*)    All other components within normal limits  SALICYLATE LEVEL - Abnormal; Notable for the following components:   Salicylate Lvl <7.0 (*)    All other components within normal limits  ACETAMINOPHEN LEVEL - Abnormal; Notable for the following components:   Acetaminophen (Tylenol), Serum <10 (*)    All other components within normal limits  CBC WITH DIFFERENTIAL/PLATELET - Abnormal; Notable for the following components:   WBC 3.4 (*)    Hemoglobin 12.3 (*)    HCT 38.1 (*)    Platelets 136 (*)    Lymphs Abs 0.2 (*)    Monocytes Absolute 0.0 (*)    All other components within normal limits  ETHANOL  RAPID URINE DRUG SCREEN, HOSP PERFORMED  URINALYSIS, ROUTINE W REFLEX MICROSCOPIC  CBG MONITORING, ED                                                                                                                          Radiology CT Head Wo Contrast  Result Date: 10/19/2022 CLINICAL  DATA:  Delirium EXAM: CT HEAD WITHOUT CONTRAST TECHNIQUE: Contiguous axial images were obtained from the base of the skull through the vertex without intravenous contrast. RADIATION DOSE REDUCTION: This exam was performed according to the departmental dose-optimization program which includes automated exposure control, adjustment of the mA and/or kV according to patient size and/or use of iterative reconstruction technique. COMPARISON:  10/06/2019 FINDINGS: Brain: No acute intracranial abnormality. Specifically, no hemorrhage, hydrocephalus, mass lesion, acute infarction, or significant intracranial injury.  Vascular: No hyperdense vessel or unexpected calcification. Skull: No acute calvarial abnormality. Sinuses/Orbits: No acute findings Other: None IMPRESSION: Normal study. Electronically Signed   By: Charlett Nose M.D.   On: 10/19/2022 21:21   DG Chest Portable 1 View  Result Date: 10/19/2022 CLINICAL DATA:  OD, altered mental status. EXAM: PORTABLE CHEST 1 VIEW COMPARISON:  04/14/2020. FINDINGS: The heart size and mediastinal contours are within normal limits. The pulmonary vasculature is distended. No consolidation, effusion, or pneumothorax. No acute osseous abnormality. IMPRESSION: Distended pulmonary vasculature. Electronically Signed   By: Thornell Sartorius M.D.   On: 10/19/2022 20:45    Pertinent labs & imaging results that were available during my care of the patient were reviewed by me and considered in my medical decision making (see MDM for details).  Medications Ordered in ED Medications  potassium chloride SA (KLOR-CON M) CR tablet 40 mEq (has no administration in time range)  ondansetron (ZOFRAN-ODT) disintegrating tablet 4 mg (4 mg Oral Given 10/19/22 1942)  sodium chloride 0.9 % bolus 1,000 mL (0 mLs Intravenous Stopped 10/19/22 2328)  lactated ringers bolus 2,000 mL (2,000 mLs Intravenous New Bag/Given 10/19/22 2326)                                                                                                                                     Procedures .Critical Care  Performed by: Sloan Leiter, DO Authorized by: Sloan Leiter, DO   Critical care provider statement:    Critical care time (minutes):  35   Critical care was necessary to treat or prevent imminent or life-threatening deterioration of the following conditions:  Dehydration   Critical care was time spent personally by me on the following activities:  Development of treatment plan with patient or surrogate, discussions with consultants, evaluation of patient's response to treatment, examination of patient, ordering  and review of laboratory studies, ordering and review of radiographic studies, ordering and performing treatments and interventions, pulse oximetry, re-evaluation of patient's condition, review of old charts and obtaining history from patient or surrogate   (including critical care time)  Medical Decision Making / ED Course    Medical Decision Making:    TEAGEN MCLEARY is a 32 y.o. 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  Serious etiology was considered. Ddx includes but is not limited to: Differential diagnoses for altered mental status includes but is not exclusive to alcohol, illicit or prescription medications, intracranial pathology such as stroke, intracerebral hemorrhage, fever or infectious causes including sepsis, hypoxemia, uremia, trauma, endocrine related disorders such as diabetes, hypoglycemia, thyroid-related diseases, etc.   Complete initial physical exam performed, notably the patient  was vomiting, he had bowel movement on himself, diaphoretic, intermittently agitated but redirectable.    Reviewed and confirmed nursing documentation for past medical history, family history, social history.  Vital signs reviewed.    Clinical Course as of 10/20/22 0042  Wed Oct 20, 2022  0000 On recheck pt feeling much better, nausea has resolved. Asking for something to eat. Reports that he has experienced opiate withdrawal in the past and his symptoms today feel very similar to prior opiate withdrawal [SG]  0002 He has mild leukopenia and mild thrombocytopenia.  No increased bruising or bleeding.  He does chronically drink alcohol.  Ethanol level was not elevated today normal  salicylate or acetaminophen level.  Creatinine is elevated 1.5, baseline Is around 1.  Continue IV fluids as concern for AKI. K slightly low, will replace [SG]  0005 BP(!): 90/58 [SG]  0006 Creatinine(!): 1.53 Recent volume loss w/ emesis/diarrhea; give further IVF [SG]    Clinical Course User Index [SG] Tanda Rockers A, DO   On recheck he continues to improve, bp improving. Concern for acute opiate withdrawal given use of suboxone in conjunction with recent heroin use.   Pt signed out to incoming edp Dr Clayborne Dana pending recheck and completion of fluids, can likely be discharged if hemodynamics continue to improve.  No SI or HI reported by pt     Additional history obtained: -Additional history obtained from ems -External records from outside source obtained and reviewed including: Chart review including previous notes, labs, imaging, consultation notes including PDMP reviewed, prior ED visits, prior labs and imaging   Lab Tests: -I ordered, reviewed, and interpreted labs.   The pertinent results include:   Labs Reviewed  COMPREHENSIVE METABOLIC PANEL - Abnormal; Notable for the following components:      Result Value   Potassium 3.3 (*)    Glucose, Bld 100 (*)    Creatinine, Ser 1.53 (*)    AST 54 (*)    ALT 54 (*)    Total Bilirubin 2.2 (*)    All other components within normal limits  SALICYLATE LEVEL - Abnormal; Notable for the following components:   Salicylate Lvl <7.0 (*)    All other components within normal limits  ACETAMINOPHEN LEVEL - Abnormal; Notable for the following components:   Acetaminophen (Tylenol), Serum <10 (*)    All other components within normal limits  CBC WITH DIFFERENTIAL/PLATELET - Abnormal; Notable for the following components:   WBC 3.4 (*)    Hemoglobin 12.3 (*)    HCT 38.1 (*)    Platelets 136 (*)    Lymphs Abs 0.2 (*)    Monocytes Absolute 0.0 (*)    All other components within normal limits  ETHANOL  RAPID URINE DRUG SCREEN, HOSP  PERFORMED  URINALYSIS, ROUTINE W REFLEX MICROSCOPIC  CBG MONITORING, ED    Notable for as above  EKG   EKG Interpretation  Date/Time:  Tuesday October 19 2022 19:24:40 EDT Ventricular Rate:  128 PR Interval:  116 QRS Duration:  77 QT Interval:  297 QTC Calculation: 434 R Axis:   79 Text Interpretation: Sinus tachycardia similar to prior Confirmed by Tanda Rockers ([id number]) on 10/19/2022 7:43:34 PM         Imaging Studies ordered: I ordered imaging studies including Cth CXR I independently visualized the following imaging with scope of interpretation limited to determining acute life threatening conditions related to emergency care; findings noted above, significant for stable imaging I independently visualized and interpreted imaging. I agree with the radiologist interpretation   Medicines ordered and prescription drug management: Meds ordered this encounter  Medications   ondansetron (ZOFRAN-ODT) disintegrating tablet 4 mg   sodium chloride 0.9 % bolus 1,000 mL   DISCONTD: lactated ringers bolus 1,000 mL   lactated ringers bolus 2,000 mL   potassium chloride SA (KLOR-CON M) CR tablet 40 mEq    -I have reviewed the patients home medicines and have made adjustments as needed   Consultations Obtained: na   Cardiac Monitoring: The patient was maintained on a cardiac monitor.  I personally viewed and interpreted the cardiac monitored which showed an underlying rhythm of: sinus tachy >> Per chart review patient's heart rate is chronically elevated, he was seen previously in the outpatient setting for palpitations and tachycardia, noncompliant with home medications.  Social Determinants of Health:  Diagnosis or treatment significantly limited by social determinants of health: current smoker, polysubstance abuse, and uninsured   Reevaluation: After the interventions noted above, I reevaluated the patient and found that they have improved  Co morbidities that complicate the  patient evaluation  Past Medical History:  Diagnosis Date   Asthma    Depression    Heroin abuse       Dispostion: Disposition decision including need for hospitalization was considered, and patient disposition pending at time of sign out.    Final Clinical Impression(s) / ED Diagnoses Final diagnoses:  None     This chart was dictated using voice recognition software.  Despite best efforts to proofread,  errors can occur which can change the documentation meaning.    Sloan Leiter, DO 10/20/22 [phone]

## 2022-10-19 NOTE — ED Triage Notes (Signed)
Pt comes in from EMS after using drugs in his car in a [employer] parking lot. Incontinent of stool. Also has episodes of vomiting. Relatively cooperative.

## 2022-10-20 ENCOUNTER — Other Ambulatory Visit: Payer: Self-pay

## 2022-10-20 DIAGNOSIS — J45909 Unspecified asthma, uncomplicated: Secondary | ICD-10-CM | POA: Diagnosis present

## 2022-10-20 DIAGNOSIS — E872 Acidosis, unspecified: Secondary | ICD-10-CM | POA: Diagnosis present

## 2022-10-20 DIAGNOSIS — A419 Sepsis, unspecified organism: Secondary | ICD-10-CM | POA: Diagnosis present

## 2022-10-20 DIAGNOSIS — R571 Hypovolemic shock: Secondary | ICD-10-CM | POA: Diagnosis present

## 2022-10-20 DIAGNOSIS — F32A Depression, unspecified: Secondary | ICD-10-CM | POA: Diagnosis present

## 2022-10-20 DIAGNOSIS — E860 Dehydration: Secondary | ICD-10-CM | POA: Diagnosis present

## 2022-10-20 DIAGNOSIS — N179 Acute kidney failure, unspecified: Secondary | ICD-10-CM | POA: Diagnosis present

## 2022-10-20 DIAGNOSIS — D696 Thrombocytopenia, unspecified: Secondary | ICD-10-CM | POA: Diagnosis present

## 2022-10-20 DIAGNOSIS — Z79899 Other long term (current) drug therapy: Secondary | ICD-10-CM | POA: Diagnosis not present

## 2022-10-20 DIAGNOSIS — G928 Other toxic encephalopathy: Secondary | ICD-10-CM | POA: Diagnosis present

## 2022-10-20 DIAGNOSIS — F17210 Nicotine dependence, cigarettes, uncomplicated: Secondary | ICD-10-CM | POA: Diagnosis present

## 2022-10-20 DIAGNOSIS — F1123 Opioid dependence with withdrawal: Secondary | ICD-10-CM | POA: Diagnosis present

## 2022-10-20 DIAGNOSIS — R6521 Severe sepsis with septic shock: Secondary | ICD-10-CM | POA: Diagnosis present

## 2022-10-20 DIAGNOSIS — E876 Hypokalemia: Secondary | ICD-10-CM | POA: Diagnosis present

## 2022-10-20 DIAGNOSIS — R579 Shock, unspecified: Secondary | ICD-10-CM | POA: Diagnosis present

## 2022-10-20 DIAGNOSIS — F419 Anxiety disorder, unspecified: Secondary | ICD-10-CM | POA: Diagnosis present

## 2022-10-20 DIAGNOSIS — I38 Endocarditis, valve unspecified: Secondary | ICD-10-CM | POA: Diagnosis not present

## 2022-10-20 DIAGNOSIS — I10 Essential (primary) hypertension: Secondary | ICD-10-CM | POA: Diagnosis present

## 2022-10-20 DIAGNOSIS — F1410 Cocaine abuse, uncomplicated: Secondary | ICD-10-CM | POA: Diagnosis present

## 2022-10-20 LAB — C DIFFICILE QUICK SCREEN W PCR REFLEX
C Diff antigen: NEGATIVE
C Diff interpretation: NOT DETECTED
C Diff toxin: NEGATIVE

## 2022-10-20 LAB — CBC WITH DIFFERENTIAL/PLATELET
Abs Immature Granulocytes: 0 10*3/uL (ref 0.00–0.07)
Basophils Absolute: 0 10*3/uL (ref 0.0–0.1)
Basophils Relative: 0 %
Eosinophils Absolute: 0 10*3/uL (ref 0.0–0.5)
Eosinophils Relative: 0 %
HCT: 30.3 % — ABNORMAL LOW (ref 39.0–52.0)
Hemoglobin: 9.6 g/dL — ABNORMAL LOW (ref 13.0–17.0)
Lymphocytes Relative: 4 %
Lymphs Abs: 0.4 10*3/uL — ABNORMAL LOW (ref 0.7–4.0)
MCH: 28 pg (ref 26.0–34.0)
MCHC: 31.7 g/dL (ref 30.0–36.0)
MCV: 88.3 fL (ref 80.0–100.0)
Monocytes Absolute: 0.1 10*3/uL (ref 0.1–1.0)
Monocytes Relative: 1 %
Neutro Abs: 10.1 10*3/uL — ABNORMAL HIGH (ref 1.7–7.7)
Neutrophils Relative %: 95 %
Platelets: 104 10*3/uL — ABNORMAL LOW (ref 150–400)
RBC: 3.43 MIL/uL — ABNORMAL LOW (ref 4.22–5.81)
RDW: 14.3 % (ref 11.5–15.5)
WBC: 10.6 10*3/uL — ABNORMAL HIGH (ref 4.0–10.5)
nRBC: 0 % (ref 0.0–0.2)
nRBC: 0 /100 WBC

## 2022-10-20 LAB — GASTROINTESTINAL PANEL BY PCR, STOOL (REPLACES STOOL CULTURE)

## 2022-10-20 LAB — COMPREHENSIVE METABOLIC PANEL
ALT: 37 U/L (ref 0–44)
AST: 41 U/L (ref 15–41)
Albumin: 2.5 g/dL — ABNORMAL LOW (ref 3.5–5.0)
Alkaline Phosphatase: 74 U/L (ref 38–126)
Anion gap: 8 (ref 5–15)
BUN: 17 mg/dL (ref 6–20)
CO2: 23 mmol/L (ref 22–32)
Calcium: 7.7 mg/dL — ABNORMAL LOW (ref 8.9–10.3)
Chloride: 107 mmol/L (ref 98–111)
Creatinine, Ser: 1.31 mg/dL — ABNORMAL HIGH (ref 0.61–1.24)
GFR, Estimated: >60 mL/min (ref >60)
Glucose, Bld: 121 mg/dL — ABNORMAL HIGH (ref 70–99)
Potassium: 4.3 mmol/L (ref 3.5–5.1)
Sodium: 138 mmol/L (ref 135–145)
Total Bilirubin: 0.7 mg/dL (ref 0.3–1.2)
Total Protein: 5 g/dL — ABNORMAL LOW (ref 6.5–8.1)

## 2022-10-20 LAB — URINALYSIS, ROUTINE W REFLEX MICROSCOPIC
Bilirubin Urine: NEGATIVE
Glucose, UA: NEGATIVE mg/dL
Hgb urine dipstick: NEGATIVE
Ketones, ur: 5 mg/dL — AB
Leukocytes,Ua: NEGATIVE
Nitrite: NEGATIVE
Protein, ur: NEGATIVE mg/dL
Specific Gravity, Urine: 1.009 (ref 1.005–1.030)
pH: 6 (ref 5.0–8.0)

## 2022-10-20 LAB — GLUCOSE, CAPILLARY
Glucose-Capillary: 105 mg/dL — ABNORMAL HIGH (ref 70–99)
Glucose-Capillary: 117 mg/dL — ABNORMAL HIGH (ref 70–99)

## 2022-10-20 LAB — PROCALCITONIN: Procalcitonin: 38.08 ng/mL

## 2022-10-20 LAB — LACTIC ACID, PLASMA
Lactic Acid, Venous: 2.6 mmol/L (ref 0.5–1.9)
Lactic Acid, Venous: 1.6 mmol/L (ref 0.5–1.9)
Lactic Acid, Venous: 2.6 mmol/L (ref 0.5–1.9)
Lactic Acid, Venous: 2.4 mmol/L (ref 0.5–1.9)

## 2022-10-20 LAB — HIV ANTIBODY (ROUTINE TESTING W REFLEX): HIV Screen 4th Generation wRfx: NONREACTIVE

## 2022-10-20 LAB — LIPASE, BLOOD: Lipase: 28 U/L (ref 11–51)

## 2022-10-20 LAB — CORTISOL: Cortisol, Plasma: 14.6 ug/dL

## 2022-10-20 LAB — MRSA NEXT GEN BY PCR, NASAL: MRSA by PCR Next Gen: NOT DETECTED

## 2022-10-20 LAB — C-REACTIVE PROTEIN: CRP: 15 mg/dL — ABNORMAL HIGH (ref <1.0)

## 2022-10-20 LAB — AMYLASE: Amylase: 33 U/L (ref 28–100)

## 2022-10-20 MED ORDER — LACTATED RINGERS IV BOLUS
1000.0000 mL | Freq: Once | INTRAVENOUS | Status: AC
  Administered 2022-10-20: 1000 mL via INTRAVENOUS

## 2022-10-20 MED ORDER — LACTATED RINGERS IV SOLN: INTRAVENOUS | Status: DC

## 2022-10-20 MED ORDER — PIPERACILLIN-TAZOBACTAM 3.375 G IVPB: 3.3750 g | Freq: Three times a day (TID) | INTRAVENOUS | Status: DC

## 2022-10-20 MED ORDER — ACETAMINOPHEN 325 MG PO TABS
650.0000 mg | ORAL_TABLET | ORAL | Status: DC | PRN
  Administered 2022-10-23: 650 mg via ORAL
  Filled 2022-10-20 (×2): qty 2

## 2022-10-20 MED ORDER — ACETAMINOPHEN 325 MG PO TABS: 650.0000 mg | ORAL_TABLET | Freq: Four times a day (QID) | ORAL | Status: DC | PRN

## 2022-10-20 MED ORDER — SODIUM CHLORIDE 0.9 % IV SOLN
250.0000 mL | INTRAVENOUS | Status: DC
  Administered 2022-10-20: 250 mL via INTRAVENOUS

## 2022-10-20 MED ORDER — FENTANYL CITRATE PF 50 MCG/ML IJ SOSY: 25.0000 ug | PREFILLED_SYRINGE | INTRAMUSCULAR | Status: DC | PRN

## 2022-10-20 MED ORDER — ONDANSETRON HCL 4 MG/2ML IJ SOLN
4.0000 mg | Freq: Four times a day (QID) | INTRAMUSCULAR | Status: DC | PRN
  Administered 2022-10-21 – 2022-10-22 (×2): 4 mg via INTRAVENOUS
  Filled 2022-10-20 (×2): qty 2

## 2022-10-20 MED ORDER — VANCOMYCIN HCL 1500 MG/300ML IV SOLN
1500.0000 mg | Freq: Once | INTRAVENOUS | Status: AC
  Administered 2022-10-20: 1500 mg via INTRAVENOUS
  Filled 2022-10-20: qty 300

## 2022-10-20 MED ORDER — LOPERAMIDE HCL 2 MG PO CAPS
4.0000 mg | ORAL_CAPSULE | Freq: Once | ORAL | Status: AC
  Administered 2022-10-20: 4 mg via ORAL
  Filled 2022-10-20: qty 2

## 2022-10-20 MED ORDER — PIPERACILLIN-TAZOBACTAM 3.375 G IVPB 30 MIN: 3.3750 g | Freq: Three times a day (TID) | INTRAVENOUS | Status: DC

## 2022-10-20 MED ORDER — ACETAMINOPHEN 650 MG RE SUPP: 650.0000 mg | Freq: Four times a day (QID) | RECTAL | Status: DC | PRN

## 2022-10-20 MED ORDER — VANCOMYCIN HCL IN DEXTROSE 1-5 GM/200ML-% IV SOLN
1000.0000 mg | Freq: Two times a day (BID) | INTRAVENOUS | Status: DC
  Administered 2022-10-20 – 2022-10-26 (×12): 1000 mg via INTRAVENOUS
  Filled 2022-10-20 (×13): qty 200

## 2022-10-20 MED ORDER — MELATONIN 5 MG PO TABS
5.0000 mg | ORAL_TABLET | Freq: Every evening | ORAL | Status: AC | PRN
  Administered 2022-10-20 – 2022-10-22 (×2): 5 mg via ORAL
  Filled 2022-10-20 (×3): qty 1

## 2022-10-20 MED ORDER — BUPRENORPHINE HCL-NALOXONE HCL 2-0.5 MG SL SUBL
2.0000 | SUBLINGUAL_TABLET | Freq: Two times a day (BID) | SUBLINGUAL | Status: DC
  Administered 2022-10-21 – 2022-10-26 (×10): 2 via SUBLINGUAL
  Filled 2022-10-20 (×11): qty 2

## 2022-10-20 MED ORDER — CHLORHEXIDINE GLUCONATE CLOTH 2 % EX PADS
6.0000 | MEDICATED_PAD | Freq: Every day | CUTANEOUS | Status: DC
  Administered 2022-10-20 – 2022-10-22 (×3): 6 via TOPICAL

## 2022-10-20 MED ORDER — POTASSIUM CHLORIDE CRYS ER 20 MEQ PO TBCR
40.0000 meq | EXTENDED_RELEASE_TABLET | Freq: Once | ORAL | Status: AC
  Administered 2022-10-20: 40 meq via ORAL
  Filled 2022-10-20: qty 2

## 2022-10-20 MED ORDER — NOREPINEPHRINE 4 MG/250ML-% IV SOLN
2.0000 ug/min | INTRAVENOUS | Status: DC
  Administered 2022-10-20: 2 ug/min via INTRAVENOUS
  Filled 2022-10-20: qty 250

## 2022-10-20 MED ORDER — ORAL CARE MOUTH RINSE: 15.0000 mL | OROMUCOSAL | Status: DC | PRN

## 2022-10-20 MED ORDER — PIPERACILLIN-TAZOBACTAM 3.375 G IVPB
3.3750 g | Freq: Three times a day (TID) | INTRAVENOUS | Status: DC
  Administered 2022-10-20 – 2022-10-26 (×17): 3.375 g via INTRAVENOUS
  Filled 2022-10-20 (×18): qty 50

## 2022-10-20 MED ORDER — POLYETHYLENE GLYCOL 3350 17 G PO PACK: 17.0000 g | PACK | Freq: Every day | ORAL | Status: DC | PRN

## 2022-10-20 MED ORDER — PIPERACILLIN-TAZOBACTAM 3.375 G IVPB
3.3750 g | Freq: Three times a day (TID) | INTRAVENOUS | Status: DC
  Administered 2022-10-20: 3.375 g via INTRAVENOUS
  Filled 2022-10-20 (×2): qty 50

## 2022-10-20 MED ORDER — DOCUSATE SODIUM 100 MG PO CAPS: 100.0000 mg | ORAL_CAPSULE | Freq: Two times a day (BID) | ORAL | Status: DC | PRN

## 2022-10-20 MED ORDER — SODIUM CHLORIDE 0.9 % IV SOLN: INTRAVENOUS | Status: DC

## 2022-10-20 MED ORDER — SODIUM CHLORIDE 0.9 % IV BOLUS
500.0000 mL | Freq: Once | INTRAVENOUS | Status: AC
  Administered 2022-10-20: 500 mL via INTRAVENOUS

## 2022-10-20 NOTE — Progress Notes (Signed)
Pharmacy Antibiotic Note  Chad James is a 32 y.o. male admitted on 10/19/2022 with bacteremia.  Pharmacy has been consulted for Vancomycin dosing.  Patient in AKI with CrCl 70-80 mL/min. sCr currently 1.53 and baseline appears 0.8-1.1  Plan: Vancomycin  IV once then  IV Q12H (eAUC 502, Scr 1.53, Vd 0.72) Zosyn 3.375g Q8H  Height:  (190.5 cm) Weight: 74.8 kg (164 lb 14.5 oz) IBW/kg (Calculated) : 84.5  Temp (24hrs), Avg:99 F (37.2 C), Min:98.4 F (36.9 C), Max:99.6 F (37.6 C)  Recent Labs  Lab 10/19/22 2030  WBC 3.4*  CREATININE 1.53*    Estimated Creatinine Clearance: 74 mL/min (A) (by C-G formula based on SCr of 1.53 mg/dL (H)).    No Known Allergies   Thank you for allowing pharmacy to be a part of this patient's care.  Ellis Savage, PharmD Clinical Pharmacist 10/20/2022 7:12 AM

## 2022-10-20 NOTE — Progress Notes (Signed)
eLink Physician-Brief Progress Note Patient Name: Chad James DOB: [birth year] MRN: [id number]   Date of Service  10/20/2022  HPI/Events of Note  Patient is requesting a sleep aid.  eICU Interventions  Melatonin 5 mg Q HS PRN ordered.        Migdalia Dk 10/20/2022, 9:42 PM

## 2022-10-20 NOTE — Consult Note (Addendum)
Consultation note   Patient: Chad James WJX:[id number] DOB: Nov 17, 1990 DOA: 10/19/2022 DOS: the patient was seen and examined on 10/20/2022 PCP: Herb Grays, MD (Inactive)  Patient coming from: Home-has girlfriend  Chief Complaint:  Chief Complaint  Patient presents with   Drug Problem   HPI: Chad James is a 32 y.o. male with medical history significant of history of asthma, depression and IV drug abuse consisting of heroin, cocaine x 10 yrs.  He also utilizes marijuana.  Patient was brought to the ED by EMS after using drugs in his car at a [employer] parking lot.  He was found to be incontinent of stool and had episodes of vomiting.  Patient reports he had recently been evaluated at a Suboxone clinic and took his first dose of Suboxone 4 mg on date of presentation.  He reported to the EDP after taking this medication he began to have abdominal cramps, profuse diarrhea and vomiting.  He was very nauseated.  In addition to clinical findings EMS noted that patient had drug paraphernalia in the vehicle.  Patient was agitated but easily redirectable.  He did not verbalize any suicidal or homicidal thoughts.  Upon presentation to the ED patient was found to have a low-grade temperature of 99.6.  He had suboptimal to hypotensive blood pressure readings anywhere between 93/58 to a low of 78/52.  He maintain appropriate O2 saturations.  His labs revealed mild hypokalemia, creatinine of 1.53 with a baseline in 2021 of 1.19, mild transaminitis with both AST and ALT 54 with a total bilirubin 2.2, white count 3400 with platelets 136,000, and hemoglobin 12.3.  Tylenol and salicylate levels within normal and no evidence of overdose.  Urinalysis unremarkable.  CT of the head that was without contrast showed no abnormalities and EKG revealed sinus tachycardia.  It was suspected that patient's symptoms were combination of narcotic withdrawal and dehydration from profuse diarrhea.  Hospitalist team initially  excepted the patient for admission given after multiple fluid challenges his systolic blood pressure increased to the 100 range.  Unfortunately his blood pressure continued to take a downward trend and upon my evaluation of the patient he had altered mentation and was slow to respond to questions.  He has received a total of 6 L of IV fluid.  Given his history of IV drug abuse there were concerns for possible sepsis physiology.  Multiple labs have been ordered and empiric vancomycin and Zosyn have been ordered with pharmacy to dose.  PCCM has been consulted and they will admit the patient to the ICU for blood pressure support with Levophed.  Review of Systems: As mentioned in the history of present illness. All other systems reviewed and are negative. Past Medical History:  Diagnosis Date   Asthma    Depression    Heroin abuse    History reviewed. No pertinent surgical history. Social History:  reports that he has been smoking cigarettes. He has been smoking an average of 1 pack per day. He has never used smokeless tobacco. He reports current alcohol use. He reports current drug use. Drugs: Marijuana, Cocaine, and IV.  No Known Allergies  History reviewed. No pertinent family history.  Prior to Admission medications   Medication Sig Start Date End Date Taking? Authorizing Provider  gabapentin (NEURONTIN) 300 MG capsule Take 300 mg by mouth at bedtime. 10/06/22  Yes [provider]  SUBOXONE 4-1 MG FILM Place 1 mg of opioid under the tongue 2 (two) times daily. 10/07/22  Yes [provider]  escitalopram (LEXAPRO) 10 MG tablet Take 1 tablet by mouth daily. Patient not taking: Reported on 10/20/2022    [provider]  naloxone [facility]) nasal spray 4 mg/0.1 mL Please give this medication intranasally should there be concern for overdose. Patient not taking: Reported on 10/20/2022 05/11/18   Keith Rake, MD    Physical Exam: Vitals:   10/20/22 0357 10/20/22 0452  10/20/22 0601 10/20/22 0632  BP: (!) 90/52 (!) 81/49 (!) 80/47 (!) 83/54  Pulse: (!) 116 (!) 109 (!) 108 100  Resp: (!) 22  Temp: 99 F (37.2 C)  98.4 F (36.9 C)   TempSrc: Oral  Oral   SpO2: 97% 95% 99% 98%  Weight:      Height:       Constitutional: NAD, calm, comfortable but is somewhat slow to respond to questions and appears drowsy and unable to stay awake during entire history and exam despite lights being on in the room. Respiratory: clear to auscultation bilaterally, no wheezing, no crackles. Normal respiratory effort. No accessory muscle use.  Room air Cardiovascular: Regular, tachycardic rate and rhythm, no murmurs / rubs / gallops. 2+ pedal pulses.  Extremities are warm to the touch although he appears to have some puffiness of the hands and feet in the context of recent infusion of 6 L of fluid boluses Abdomen: no tenderness, no masses palpated. No hepatosplenomegaly. Bowel sounds positive.  Musculoskeletal: no clubbing / cyanosis. No joint deformity upper and lower extremities. Good ROM, no contractures. Normal muscle tone.  Skin: no rashes, lesions, ulcers. No induration.  Patient does have a superficial scratch involving the dorsum of the right wrist and hand.  No purulence or induration.  Patient unclear as to etiology of wound.  States does not own any pets. Neurologic: CN 2-12 grossly intact. Sensation intact, Strength 4/5 x all 4 extremities.  Psychiatric: Patient drowsy and difficult to awaken.  When he briefly responsive seems to be oriented and answers questions appropriately normal mood.   Data Reviewed:  As per HPI  Assessment and Plan: Persistent hypotension/rule out sepsis physiology Appreciate PCCM assuming care of this patient Sepsis workup has been initiated and includes the following studies: CRP, C Diff PCR, follow-up CBC and CMET, cortisol, HIV, lactic acid, procalcitonin, blood cultures Empiric Zosyn and vancomycin have been ordered and pharmacy  will dose For now continue IV fluids at 200/hr.  These will be titrated per PCCM noting Levophed has now been initiated to help with blood pressure support Infectious source suspected to be possible bacteremia.  Chest x-ray unremarkable except for distended pulmonary vasculature; UA unremarkable  IV drug abuse with heroin and cocaine x 10 years Continue to monitor for withdrawal symptoms Currently stable without signs of agitation  Thrombocytopenia Could be reactive in the setting of hypotension or could be secondary to sepsis physiology Continue to follow  History of asthma On my exam lung sounds are clear without any wheezing on room air Initiate supportive care as needed   Advance Care Planning:   Code Status: Full Code   VTE prophylaxis: Initiate Lovenox but if platelets continue to drop we will need to discontinue and replace with SCDs  Consults: PCCM who have assumed attending responsibilities for this patient  Family Communication: Patient only  Severity of Illness: The appropriate patient status for this patient is INPATIENT. Inpatient status is judged to be reasonable and necessary in order to provide the required intensity of service to ensure the patient's safety.  The patient's presenting symptoms, physical exam findings, and initial radiographic and laboratory data in the context of their chronic comorbidities is felt to place them at high risk for further clinical deterioration. Furthermore, it is not anticipated that the patient will be medically stable for discharge from the hospital within 2 midnights of admission.   * I certify that at the point of admission it is my clinical judgment that the patient will require inpatient hospital care spanning beyond 2 midnights from the point of admission due to high intensity of service, high risk for further deterioration and high frequency of surveillance required.*  Author: Junious Silk, NP 10/20/2022 8:07 AM  For on call  review [email or web address].

## 2022-10-20 NOTE — ED Provider Notes (Signed)
5:01 AM Assumed care from Dr. Wallace Cullens, please see their note for full history, physical and decision making until this point. In brief this is a 32 y.o. year old male who presented to the ED tonight with Drug Problem     Took fentanyl. Decided he wanted to quit. Took suboxone. Had significant diarrhea and emesis multiple times. Came in. Is dehydrated. Pending more fluids.   After >6L total of fluids, still soft pressures, tachycardic and hasn't made urine yet. Feels well. Looks well. Ambulates well. D/w Dr. Arlean Hopping for admission.   CRITICAL CARE Performed by: Marily Memos Total critical care time: 35 minutes Critical care time was exclusive of separately billable procedures and treating other patients. Critical care was necessary to treat or prevent imminent or life-threatening deterioration. Critical care was time spent personally by me on the following activities: development of treatment plan with patient and/or surrogate as well as nursing, discussions with consultants, evaluation of patient's response to treatment, examination of patient, obtaining history from patient or surrogate, ordering and performing treatments and interventions, ordering and review of laboratory studies, ordering and review of radiographic studies, pulse oximetry and re-evaluation of patient's condition.   Labs, studies and imaging reviewed by myself and considered in medical decision making if ordered. Imaging interpreted by radiology.  Labs Reviewed  COMPREHENSIVE METABOLIC PANEL - Abnormal; Notable for the following components:      Result Value   Potassium 3.3 (*)    Glucose, Bld 100 (*)    Creatinine, Ser 1.53 (*)    AST 54 (*)    ALT 54 (*)    Total Bilirubin 2.2 (*)    All other components within normal limits  SALICYLATE LEVEL - Abnormal; Notable for the following components:   Salicylate Lvl <7.0 (*)    All other components within normal limits  ACETAMINOPHEN LEVEL - Abnormal; Notable for the  following components:   Acetaminophen (Tylenol), Serum <10 (*)    All other components within normal limits  CBC WITH DIFFERENTIAL/PLATELET - Abnormal; Notable for the following components:   WBC 3.4 (*)    Hemoglobin 12.3 (*)    HCT 38.1 (*)    Platelets 136 (*)    Lymphs Abs 0.2 (*)    Monocytes Absolute 0.0 (*)    All other components within normal limits  URINALYSIS, ROUTINE W REFLEX MICROSCOPIC - Abnormal; Notable for the following components:   APPearance HAZY (*)    Ketones, ur 5 (*)    All other components within normal limits  ETHANOL  RAPID URINE DRUG SCREEN, HOSP PERFORMED  CBG MONITORING, ED    CT Head Wo Contrast  Final Result    DG Chest Portable 1 View  Final Result      No follow-ups on file.    Tyrome Donatelli, Barbara Cower, MD 10/20/22 [phone]

## 2022-10-20 NOTE — H&P (Signed)
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 Presents with nausea vomiting diarrhea and hypotension has proven refractory to 5 L IV fluid.  He reports he does have a history of hepatitis.  His last IV drug abuse was 2 days prior to admission he started Suboxone 4 days prior to admission.  Pulmonary critical care asked to admit due to his refractory hypertension.  Notes some prolonged attempted but due to his anxiety he is unable to tolerate procedure.    Pertinent  Medical History   Past Medical History:  Diagnosis Date   Asthma    Depression    Heroin abuse      Significant Hospital Events: Including procedures, antibiotic start and stop dates in addition to other pertinent events   10/20/2018 4 suture line attempted aborted due to anxiety and the patient.  Interim History / Subjective:  32 year old male known IV drug abuser [facility] for drug now with hypotension nausea diarrhea suspected sepsis  Objective   Blood pressure (!) 83/54, pulse 100, temperature 98.4 F (36.9 C), temperature source Oral, resp. rate (!) 22, height  (1.905 m), weight 74.8 kg, SpO2 98 %.        Intake/Output Summary (Last 24 hours) at 10/20/2022 0749 Last data filed at 10/20/2022 0601 Gross per 24 hour  Intake 2000 ml  Output --  Net 2000 ml   Filed Weights   10/19/22 1911  Weight: 74.8 kg    Examination: General: Well-nourished well-developed male extremely anxious HENT: Mild JVD Lungs: Clear to auscultation Cardiovascular: Heart sounds are regular Abdomen: Somewhat tender Extremities: Without edema Neuro: Grossly intact.  Anxious GU: Has not voided at this point  Resolved Hospital Problem list      Assessment & Plan:  Shock in the setting of nausea vomiting diarrhea IV drug abuse and recently starting Suboxone.  Questionable sepsis he has proven refractory to 6 L of IV fluids with a blood pressure of 75 systolic.  Unable to obtain peripheral IVs. Empirical antimicrobial therapy Needs central line placed for administration of vasopressors Peripheral vasopressors if unable to place central line Admission to the intensive care unit [place] Continue fluid resuscitation 2D echo rule out vegetation heart Procalcitonin  History of hepatitis Monitor  Acute kidney injury Lab Results  Component Value Date   CREATININE 1.53 (H) 10/19/2022   CREATININE 1.19 04/14/2020   CREATININE 0.80 08/08/2019  Fluid resuscitation Monitor creatinine  Anxiety May need anxiolytics and narcotics tolerated the procedures.  Best Practice (right click and "Reselect all SmartList Selections" daily)   Diet/type: NPO DVT prophylaxis: not indicated GI prophylaxis: PPI Lines: N/A Foley:  N/A Code Status:  full code Last date of multidisciplinary goals of care discussion [tbd]  Labs   CBC: Recent Labs  Lab 10/19/22 2030  WBC 3.4*  NEUTROABS 3.1  HGB 12.3*  HCT 38.1*  MCV 84.9  PLT 136*    Basic Metabolic Panel: Recent Labs  Lab 10/19/22 2030  NA 135  K 3.3*  CL 99  CO2 22  GLUCOSE 100*  BUN 15  CREATININE 1.53*  CALCIUM 8.9   GFR: Estimated Creatinine Clearance: 74 mL/min (A) (by C-G formula based on SCr of 1.53 mg/dL (H)). Recent Labs  Lab 10/19/22 2030  WBC 3.4*    Liver Function Tests: Recent Labs  Lab 10/19/22 2030  AST 54*  ALT 54*  ALKPHOS 119  BILITOT 2.2*  PROT 6.8  ALBUMIN 3.7   No results for input(s): "LIPASE", "AMYLASE" in the last 168 hours. No results for input(s): "AMMONIA" in the last 168 hours.  ABG No results found for: "PHART", "PCO2ART", "PO2ART", "HCO3", "TCO2", "ACIDBASEDEF", "O2SAT"   Coagulation Profile: No results for input(s):  "INR", "PROTIME" in the last 168 hours.  Cardiac Enzymes: No results for input(s): "CKTOTAL", "CKMB", "CKMBINDEX", "TROPONINI" in the last 168 hours.  HbA1C: No results found for: "HGBA1C"  CBG: Recent Labs  Lab 10/19/22 1942  GLUCAP 98    Review of Systems:   10 point review of system taken, please see HPI for positives and negatives. Positive for 2 days of nausea and vomiting Reportedly did IV heroin 2 days prior to admission Started Suboxone 4 days prior to admission  Past Medical History:  He,  has a past medical history of Asthma, Depression, and Heroin abuse.   Surgical History:  History reviewed. No pertinent surgical history.   Social History:   reports that he has been smoking cigarettes. He has been smoking an average of 1 pack per day. He has never used smokeless tobacco. He reports current alcohol use. He reports current drug use. Drugs: Marijuana, Cocaine, and IV.   Family History:  His family history is not on file.   Allergies No Known Allergies   Home Medications  Prior to Admission medications   Medication Sig Start Date End Date Taking? Authorizing Provider  gabapentin (NEURONTIN) 300 MG capsule Take 300 mg by mouth at bedtime. 10/06/22  Yes [provider]  SUBOXONE 4-1 MG FILM Place 1 mg of opioid under the tongue 2 (two) times daily. 10/07/22  Yes [provider]  escitalopram (LEXAPRO) 10 MG tablet Take 1 tablet by mouth daily. Patient not taking: Reported on 10/20/2022    [provider]  naloxone [facility]) nasal spray 4 mg/0.1 mL Please give this medication intranasally should there be concern for overdose. Patient not taking: Reported on 10/20/2022 05/11/18   Rozier, Winfield Rast, MD     Critical care time: 45 min    Brett Canales Diona Peregoy ACNP Acute Care Nurse Practitioner [facility] Pulmonary/Critical Care Please consult Amion 10/20/2022, 7:49 AM

## 2022-10-20 NOTE — ED Notes (Signed)
ED TO INPATIENT HANDOFF REPORT  ED Nurse Name and Phone #: Josh  S Name/Age/Gender Chad James 32 y.o. male Room/Bed: 004C/004C  Code Status   Code Status: Full Code  Home/SNF/Other Home Patient oriented to: self, place, time, and situation Is this baseline? Yes   Triage Complete: Triage complete  Chief Complaint AKI (acute kidney injury) [N17.9] Shock [R57.9]  Triage Note Pt comes in from EMS after using drugs in his car in a [employer] parking lot. Incontinent of stool. Also has episodes of vomiting. Relatively cooperative.    Allergies No Known Allergies  Level of Care/Admitting Diagnosis ED Disposition     ED Disposition  Admit   Condition  --   Comment  Hospital Area: [facility] [[id number]]  Level of Care: ICU [6]  May admit patient to [facility] or [facility] if equivalent level of care is available:: No  Covid Evaluation: Asymptomatic - no recent exposure (last 10 days) testing not required  Diagnosis: Shock [205182]  Admitting Physician: Martina Sinner [[id number]]  Attending Physician: Martina Sinner [[id number]]  Certification:: I certify this patient will need inpatient services for at least 2 midnights  Estimated Length of Stay: 3          B Medical/Surgery History Past Medical History:  Diagnosis Date   Asthma    Depression    Heroin abuse    History reviewed. No pertinent surgical history.   A IV Location/Drains/Wounds Patient Lines/Drains/Airways Status     Active Line/Drains/Airways     Name Placement date Placement time Site Days   Peripheral IV 10/19/22 20 G 1.88" Anterior;Right;Upper Arm 10/19/22  2036  Arm  1            Intake/Output Last 24 hours  Intake/Output Summary (Last 24 hours) at 10/20/2022 0944 Last data filed at 10/20/2022 1914 Gross per 24 hour  Intake 3000 ml  Output --  Net 3000 ml    Labs/Imaging Results for orders placed or performed during the hospital encounter of 10/19/22  (from the past 48 hour(s))  Salicylate level     Status: Abnormal   Collection Time: 10/19/22  7:05 PM  Result Value Ref Range   Salicylate Lvl <7.0 (L) 7.0 - 30.0 mg/dL    Comment: Performed at [facility], [address], [place], [place] [address]  Acetaminophen level     Status: Abnormal   Collection Time: 10/19/22  7:05 PM  Result Value Ref Range   Acetaminophen (Tylenol), Serum <10 (L) 10 - 30 ug/mL    Comment: (NOTE) Therapeutic concentrations vary significantly. A range of 10-30 ug/mL  may be an effective concentration for many patients. However, some  are best treated at concentrations outside of this range. Acetaminophen concentrations >150 ug/mL at 4 hours after ingestion  and >50 ug/mL at 12 hours after ingestion are often associated with  toxic reactions.  Performed at [facility], [address] [place], [place] [address]   Ethanol     Status: None   Collection Time: 10/19/22  7:05 PM  Result Value Ref Range   Alcohol, Ethyl (B) <10 <10 mg/dL    Comment: (NOTE) Lowest detectable limit for serum alcohol is 10 mg/dL.  For medical purposes only. Performed at [facility], [address], [place], [place] [address]   CBG monitoring, ED     Status: None   Collection Time: 10/19/22  7:42 PM  Result Value Ref Range  Glucose-Capillary 98 70 - 99 mg/dL    Comment: Glucose reference range applies only to samples taken after fasting for at least 8 hours.  Comprehensive metabolic panel     Status: Abnormal   Collection Time: 10/19/22  8:30 PM  Result Value Ref Range   Sodium 135 135 - 145 mmol/L   Potassium 3.3 (L) 3.5 - 5.1 mmol/L   Chloride 99 98 - 111 mmol/L   CO2 22 22 - 32 mmol/L   Glucose, Bld 100 (H) 70 - 99 mg/dL    Comment: Glucose reference range applies only to samples taken after fasting for at least 8 hours.   BUN 15 6 - 20 mg/dL   Creatinine, Ser 2.44 (H) 0.61 - 1.24 mg/dL   Calcium 8.9 8.9 - 01.0 mg/dL   Total Protein 6.8 6.5 -  8.1 g/dL   Albumin 3.7 3.5 - 5.0 g/dL   AST 54 (H) 15 - 41 U/L   ALT 54 (H) 0 - 44 U/L   Alkaline Phosphatase 119 38 - 126 U/L   Total Bilirubin 2.2 (H) 0.3 - 1.2 mg/dL   GFR, Estimated >27 >25 mL/min    Comment: (NOTE) Calculated using the CKD-EPI Creatinine Equation (2021)    Anion gap 14 5 - 15    Comment: Performed at [facility], [address] [place], [place] [address]  CBC WITH DIFFERENTIAL     Status: Abnormal   Collection Time: 10/19/22  8:30 PM  Result Value Ref Range   WBC 3.4 (L) 4.0 - 10.5 K/uL   RBC 4.49 4.22 - 5.81 MIL/uL   Hemoglobin 12.3 (L) 13.0 - 17.0 g/dL   HCT 03.4 (L) 74.2 - 59.5 %   MCV 84.9 80.0 - 100.0 fL   MCH 27.4 26.0 - 34.0 pg   MCHC 32.3 30.0 - 36.0 g/dL   RDW 63.8 75.6 - 43.3 %   Platelets 136 (L) 150 - 400 K/uL   nRBC 0.0 0.0 - 0.2 %   Neutrophils Relative % 91 %   Neutro Abs 3.1 1.7 - 7.7 K/uL   Lymphocytes Relative 7 %   Lymphs Abs 0.2 (L) 0.7 - 4.0 K/uL   Monocytes Relative 1 %   Monocytes Absolute 0.0 (L) 0.1 - 1.0 K/uL   Eosinophils Relative 0 %   Eosinophils Absolute 0.0 0.0 - 0.5 K/uL   Basophils Relative 1 %   Basophils Absolute 0.0 0.0 - 0.1 K/uL   WBC Morphology INCREASED BANDS (>20% BANDS)     Comment: VACUOLATED NEUTROPHILS   nRBC 0 0 /100 WBC   Immature Granulocytes 0 %   Abs Immature Granulocytes 0.00 0.00 - 0.07 K/uL    Comment: Performed at [facility], [address] [place], [place] [address]  Urinalysis, Routine w reflex microscopic -Urine, Clean Catch     Status: Abnormal   Collection Time: 10/20/22  1:38 AM  Result Value Ref Range   Color, Urine YELLOW YELLOW   APPearance HAZY (A) CLEAR   Specific Gravity, Urine 1.009 1.005 - 1.030   pH 6.0 5.0 - 8.0   Glucose, UA NEGATIVE NEGATIVE mg/dL   Hgb urine dipstick NEGATIVE NEGATIVE   Bilirubin Urine NEGATIVE NEGATIVE   Ketones, ur 5 (A) NEGATIVE mg/dL   Protein, ur NEGATIVE NEGATIVE mg/dL   Nitrite NEGATIVE NEGATIVE   Leukocytes,Ua NEGATIVE NEGATIVE     Comment: Performed at [facility], [address] [place], [place] [address]   CT Head Wo  Contrast  Result Date: 10/19/2022 CLINICAL DATA:  Delirium EXAM: CT HEAD WITHOUT CONTRAST TECHNIQUE: Contiguous axial images were obtained from the base of the skull through the vertex without intravenous contrast. RADIATION DOSE REDUCTION: This exam was performed according to the departmental dose-optimization program which includes automated exposure control, adjustment of the mA and/or kV according to patient size and/or use of iterative reconstruction technique. COMPARISON:  10/06/2019 FINDINGS: Brain: No acute intracranial abnormality. Specifically, no hemorrhage, hydrocephalus, mass lesion, acute infarction, or significant intracranial injury. Vascular: No hyperdense vessel or unexpected calcification. Skull: No acute calvarial abnormality. Sinuses/Orbits: No acute findings Other: None IMPRESSION: Normal study. Electronically Signed   By: Charlett Nose M.D.   On: 10/19/2022 21:21   DG Chest Portable 1 View  Result Date: 10/19/2022 CLINICAL DATA:  OD, altered mental status. EXAM: PORTABLE CHEST 1 VIEW COMPARISON:  04/14/2020. FINDINGS: The heart size and mediastinal contours are within normal limits. The pulmonary vasculature is distended. No consolidation, effusion, or pneumothorax. No acute osseous abnormality. IMPRESSION: Distended pulmonary vasculature. Electronically Signed   By: Thornell Sartorius M.D.   On: 10/19/2022 20:45    Pending Labs Unresulted Labs (From admission, onward)     Start     Ordered   10/20/22 0915  Cortisol  Once,   R        10/20/22 0914   10/20/22 0907  Stool culture  ONCE - URGENT,   URGENT        10/20/22 0906   10/20/22 0906  Gastrointestinal Panel by PCR , Stool  (Gastrointestinal Panel by PCR, Stool                                                                                                                                                     **Does Not include  CLOSTRIDIUM DIFFICILE testing. **If CDIFF testing is needed, place order from the "C Difficile Testing" order set.**)  Once,   R        10/20/22 0906   10/20/22 0847  Amylase  Once,   R        10/20/22 0848   10/20/22 0847  Lipase, blood  Once,   R        10/20/22 0848   10/20/22 0847  Procalcitonin  Once,   R       References:    Procalcitonin Lower Respiratory Tract Infection AND Sepsis Procalcitonin Algorithm   10/20/22 0848   10/20/22 0847  Lactic acid, plasma  STAT Now then every 3 hours,   R (with STAT occurrences)      10/20/22 0848   10/20/22 0710  Rapid urine drug screen (hospital performed)  ONCE - STAT,   STAT        10/20/22 0709   10/20/22 0635  C Difficile Quick Screen w PCR reflex  (C Difficile quick  screen w PCR reflex panel )  Once, for 24 hours,   TIMED       References:    CDiff Information Tool   10/20/22 0634   10/20/22 0635  HIV Antibody (routine testing w rflx)  (HIV Antibody (Routine testing w reflex) panel)  Once,   R        10/20/22 0634   10/20/22 0633  C-reactive protein  ONCE - URGENT,   URGENT        10/20/22 0632   10/20/22 0633  Lactic acid, plasma  Now then every 2 hours,   R (with TIMED occurrences)      10/20/22 0632   10/20/22 0632  Comprehensive metabolic panel  ONCE - STAT,   STAT        10/20/22 0632   10/20/22 0632  CBC with Differential/Platelet  ONCE - STAT,   STAT        10/20/22 0632   10/20/22 0632  Culture, blood (Routine X 2) w Reflex to ID Panel  BLOOD CULTURE X 2,   R (with TIMED occurrences)      10/20/22 0632   10/20/22 0632  Procalcitonin  ONCE - URGENT,   URGENT       References:    Procalcitonin Lower Respiratory Tract Infection AND Sepsis Procalcitonin Algorithm   10/20/22 1610   10/19/22 1905  Urine rapid drug screen (hosp performed)  Once,   STAT        10/19/22 1905            Vitals/Pain Today's Vitals   10/20/22 0830 10/20/22 0845 10/20/22 0900 10/20/22 0915  BP: 91/62 (!) 78/48 (!) 85/48 (!) 79/50  Pulse: (!) 103  (!) 101 (!) 103 95  Resp: (!) 29 15 (!) 23 15  Temp:      TempSrc:      SpO2: 100% 100% 97% 100%  Weight:      Height:      PainSc:        Isolation Precautions Enteric precautions (UV disinfection)  Medications Medications  lactated ringers infusion ( Intravenous Not Given 10/20/22 0914)  vancomycin (VANCOREADY) IVPB 1500 mg/300 mL (1,500 mg Intravenous New Bag/Given 10/20/22 0921)  piperacillin-tazobactam (ZOSYN) IVPB 3.375 g (3.375 g Intravenous New Bag/Given 10/20/22 0918)  vancomycin (VANCOCIN) IVPB 1000 mg/200 mL premix (has no administration in time range)  docusate sodium (COLACE) capsule 100 mg (has no administration in time range)  polyethylene glycol (MIRALAX / GLYCOLAX) packet 17 g (has no administration in time range)  0.9 %  sodium chloride infusion (has no administration in time range)  acetaminophen (TYLENOL) tablet 650 mg (has no administration in time range)  ondansetron (ZOFRAN) injection 4 mg (has no administration in time range)  0.9 %  sodium chloride infusion (has no administration in time range)  norepinephrine (LEVOPHED) 4mg  in (0.016 mg/mL) premix infusion (2 mcg/min Intravenous New Bag/Given 10/20/22 0917)  fentaNYL (SUBLIMAZE) injection 25-100 mcg (has no administration in time range)  ondansetron (ZOFRAN-ODT) disintegrating tablet 4 mg (4 mg Oral Given 10/19/22 1942)  sodium chloride 0.9 % bolus 1,000 mL (0 mLs Intravenous Stopped 10/19/22 2328)  lactated ringers bolus 2,000 mL (0 mLs Intravenous Stopped 10/20/22 0030)  potassium chloride SA (KLOR-CON M) CR tablet 40 mEq (40 mEq Oral Given 10/20/22 0027)  lactated ringers bolus 1,000 mL (0 mLs Intravenous Stopped 10/20/22 0357)  loperamide (IMODIUM) capsule 4 mg (4 mg Oral Given 10/20/22 0217)  lactated ringers bolus 1,000 mL (0 mLs  Intravenous Stopped 10/20/22 0601)  lactated ringers bolus 1,000 mL (0 mLs Intravenous Stopped 10/20/22 0853)  sodium chloride 0.9 % bolus 500 mL (500 mLs Intravenous New  Bag/Given 10/20/22 0915)    Mobility walks     Focused Assessments     R Recommendations: See Admitting Provider Note  Report given to:   Additional Notes:

## 2022-10-20 NOTE — Discharge Instructions (Addendum)
Follow with Primary MD Chad Grays, MD (Inactive) in 7 days   Get CBC, CMP, 2 view Chest X ray -  checked next visit with your primary MD and you need a CT scan with IV contrast maxillofacial and neck by a family physician in the next 3 to 4 weeks.  Activity: As tolerated with Full fall precautions use walker/cane & assistance as needed  Disposition Home    Diet: Heart Healthy    Special Instructions: If you have smoked or chewed Tobacco  in the last 2 yrs please stop smoking, stop any regular Alcohol  and or any Recreational drug use.  On your next visit with your primary care physician please Get Medicines reviewed and adjusted.  Please request your Prim.MD to go over all Hospital Tests and Procedure/Radiological results at the follow up, please get all Hospital records sent to your Prim MD by signing hospital release before you go home.  If you experience worsening of your admission symptoms, develop shortness of breath, life threatening emergency, suicidal or homicidal thoughts you must seek medical attention immediately by calling 911 or calling your MD immediately  if symptoms less severe.  You Must read complete instructions/literature along with all the possible adverse reactions/side effects for all the Medicines you take and that have been prescribed to you. Take any new Medicines after you have completely understood and accpet all the possible adverse reactions/side effects.        In a time of Crisis: [facility]  Mobile Crisis Management provides immediate crisis response, 24/7.  Call [phone]  [facility] for MH/DD/SA [facility] is available 24 hours a day, 7 days a week. Customer Service Specialists will assist you to find a crisis provider that is well-matched with your needs. Your local number is: [phone]  [facility] ([facility]) IOP, individual counseling, medication  management [address] [place], [place] [address] [phone] Call for intake hours; Medicaid and Uninsured    Outpatient Providers  [facility]) Group and individual counseling. [address]  [place], [place] [address] [phone] [place]: [phone]  [place]: [phone] Medicaid and uninsured.   [facility] Offers IOP groups multiple times per week. [address] [place], [place] [address] [phone] Takes Medicaid and other insurances.   [facility]  Chemical Dependency Intensive Outpatient Program (IOP) [address] [place], [place] [address] [phone] Takes Nurse, learning disability and [place].   [facility]  IOP and Partial Hospitalization Program  [address]  [place], [place] [address] [phone] Private Insurance, [place] only for partial hospitalization  [facility] [address], [address], [place], [place] [address] [phone] Monday-Friday. Short and Long term options. [facility] ([facility]) IOP, individual counseling, medication management [address] [place], [place] [address] [phone] Medicaid and [facility]  [facility] [address]  [place], [place] [address] [phone] Private Insurance and Self Pay   [facility] [address]  [place], [place] [address] [phone] Private Insurance, [place], and Self Pay   [facility]  [address] [place], [place] [address] [facility]  [address]  [place], [place] [address] [phone]  [facility]  [address] [place], [place] [address] [phone]      Residential Treatment Programs  [facility] [address] [place], [place] [address] [phone] or [phone] Detox and  Residential Rehab 14 days (Medicare, [place], private insurance, and self pay). No methadone. Call for pre-screen.   [facility]) [address]  [place], [place] [address] [phone] Detox (self Pay and Medicaid Limited availability) Rehab Only Male (Medicare, Medicaid, and Self Pay)-No methadone.  [facility] [address] [place], [place] [address] [phone] or [phone] Private Insurance only  [facility] [address] [place], [place] [address] Phone:  [phone] Must be detoxed 72 hours prior to admission; 28 day program.  Self-pay.  [facility] [address]  [place], [place] [phone] [employer], Medicare, [place] (not straight [place]). They offer assistance with transportation.   [facility] [address] [place],  [place], [place] [address] [phone] Christian Based Program. Men only. No insurance  [facility] [address] [place], [place] [address] Women's: [phone] Men's: [phone] No Medicaid.   [facility] [place] Locations across the [place] (mainly [place]) willing to help with transportation.  [phone] [employer]. [facility]  [address] [place]  [place], [place] [address] [phone] Treatment Only, must make assessment appointment, and must be sober for assessment appointment. Self pay, Medicare A and B, [facility], must be [facility] resident. No methadone.   [facility]  [address] [place], [place] [address] [phone] No pending legal charges, Long-term work program. No methadone. Call for assessment.  [facility]  [address], [place], [place] [address] [phone] or [phone] Commercial Insurance Only  [facility] - [phone] Private Insurance (no  [place]). Males/Females, call to make referrals, multiple facilities   [facility] [address]  [place], [place] [address]  [phone] Men Only Upfront Fee   [facility]-no methadone [facility] [address] [place], [place] [address] [phone] (f)         Syringe Services Program: Due to COVID-19, syringe services programs are likely operating under different hours with limited or no fixed site hours. Some programs may not be operating at all. Please contact the program directly using the phone numbers provided below to see if they are still operating under COVID-19.  [facility] Solution to the Opioid Problem ([facility]STOP) Fixed; mobile; peer-based Roxy Cedar [phone] [email or web address] Fixed site exchange at [facility], [address] [place], [place] [address] on Wednesdays (2:00 - 5:00 pm) and Thursdays (4:00 - 8:00 pm). Pop-up mobile exchange locations: [employer], [address] [place], [place] [address] on Tuesdays (11:00 am - 1:00 pm) and Fridays (11:00 am - 1:00 pm)  -[facility] - [address], [place], [place] [address] on Tuesdays (2:00 - 4:00 pm) and Fridays (2:00 - 4:00 pm)  -[facility] copy - also serves Radio broadcast assistant and [employer] [place] [employer]; Fixed; mobile; peer-based; [place] Ka [phone] [email or web address] [address] [place], [place] [address] Delivery and outreach available in [place] and [place], please call for more information. Monday, Tuesday: 1:00 -7:00 pm, Thursday: 4:00 pm - 8:00 pm or Friday: 1:00 pm - 8:00 pm  Medication-Assisted Treatment (MAT):  -[facility] services [address] and surrounding areas including [place], [place], [place], [place], [address], [place], [place], [place], [place], and [place], [place]. Options include Methadone, buprenorphine or Suboxone. [address], [address] [place], [place] [address] Phone: [phone] Mon - Fri: 5:30am - 2:00pm Sat: 5:30am -7:30am Sun: Closed Holidays: 6:00am - 8:00am  -[facility]- We use FDA-approved medications, like methadone/suboxone/sublocade, and vivitrol. These medications are then combined with customized care plans that include individual or group counseling, toxicology, and medical care directed by on-site physicians. Accepts most insurance plans, Medicaid, and private pay.  [address] [place], [place] [address] Phone: [phone] Monday-Friday 5:00 AM - 10:00 AM Saturday 6:00 AM - 8:30 AM Sunday 6:00 AM - 7:00 AM  -[facility]- [facility] is a treatment & recovery focused program. In addition to receiving methadone medication, our clients participate in individual and group counseling as well as random drug testing. If accepted into the [facility] Opioid Program, you will be provided several intake appointments and a physical exam [address] [place], [place] [address] Office: [phone]  Fax: [phone]  -[facility]- We put our community members at the center of everything we do, for remote treatment services as well as in-person, from alcohol withdrawal to opioid use and more.  [address], [place], [place] [address] [phone] Monday-Wednesday: 9:00am - 5:00pm Thursday: 9:00am - 6:00pm Friday: 9:00am - 5:00pm Saturday: 9:00am - 1:00pm Sunday: Closed  -[facility] [place]) [address] [place], [place] [address] [phone]  [place] [phone] [address] [place], [place] [address]  M-W    5:00am-12:00pm Thu     5:00am-10:00am Fri       5:00am-12:00pm Sat      5:00am-8:00am Sun     Closed  $12/daily for Methadone Treatment.

## 2022-10-20 NOTE — Progress Notes (Signed)
Brief note 32 year old male known chronic heroin habituation-recent Rx Suboxone-brought to ED allegedly after coming in from the [employer] parking lot having found drugs in his car he was incontinent of stool he had vomiting cramping diarrhea (patient attributes this to taking Suboxone) no suicide homicidal ideation Found to be persistently hypotensive despite IVF with an associated AKI-status post 5, 5-1/2 L NS and somnolent at bedside Please see rest of documentation as per Ms. Rennis Harding  O/e Ill-appearing white male somnolent-pupils 3 mm Neck.  Soft supple He is quite tachycardic into the 110s to 150s with sinus tach on monitors Abdomen is soft Lower extremities have grade 2 lower extremity edema Rest of exam deferred given somnolence-see Mrs. Marybelle Killings note  Agree with her thought process-need to broaden differential-BC X2, UC, IVF bolus to several times-await lactic acid, procalcitonin,  Have discussed with Mr. Bennett Scrape of critical care who will assess the patient for persistent hypotension Appreciate consult/input from them  Pleas Koch, MD [facility] 7:26 AM

## 2022-10-20 NOTE — Progress Notes (Signed)
  Carryover admission to the Day Admitter.  I discussed this case with the EDP, Dr. Clayborne Dana.  Per these discussions:   This is a 32 year old male with chronic opioid use, who has been denting to reduce his opioid use, presents stating development of profuse diarrhea as well as some nausea/vomiting.  He is being admitted for acute kidney injury, dehydration, with presenting creatinine noted to be 1.5 representing interval increase from his baseline.  Initial heart rates, associate with sinus tachycardia, noted to be in the 130s, I have been decreasing with IV fluids, now in the low 100s; initial soft blood pressures, but answers of improvement with interval IV fluids as well.  There is loose stools are improving, no reported additional episodes of loose stool over the last several hours..  Per my discussions with EDP, the patient is prescribed a beta-blocker as an outpatient, but is unclear as to his compliance with this, and the potential ramifications this poses to his residual sinus tachycardia.  I have placed an order for observation for further evaluation management of the above.  I have placed some additional preliminary admit orders via the adult multi-morbid admission order set. I have also ordered additional continuous lactated Ringer's running at 150 cc/h.     Newton Pigg, DO Hospitalist

## 2022-10-20 NOTE — ED Notes (Signed)
Gave patient food. Patient is much more alert now. He his polite, calm and cooperative. Aysmptomatic when he walked to the bathroom, and was steady despite his blood pressure running soft. Patient is also still tachycardic. MD ordered more fluids and some immodium for his diarrhea.

## 2022-10-21 LAB — CULTURE, BLOOD (ROUTINE X 2): Special Requests: ADEQUATE

## 2022-10-21 MED ORDER — BANATROL TF EN LIQD
60.0000 mL | Freq: Two times a day (BID) | ENTERAL | Status: DC
  Administered 2022-10-21 – 2022-10-25 (×4): 60 mL via ORAL
  Filled 2022-10-21 (×8): qty 60

## 2022-10-21 MED ORDER — BUPRENORPHINE HCL-NALOXONE HCL 2-0.5 MG SL SUBL: 2.0000 | SUBLINGUAL_TABLET | SUBLINGUAL | Status: AC | PRN

## 2022-10-21 MED ORDER — NICOTINE 21 MG/24HR TD PT24
21.0000 mg | MEDICATED_PATCH | Freq: Every day | TRANSDERMAL | Status: DC
  Administered 2022-10-21 – 2022-10-26 (×6): 21 mg via TRANSDERMAL
  Filled 2022-10-21 (×6): qty 1

## 2022-10-21 NOTE — Progress Notes (Addendum)
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 Presents with nausea vomiting diarrhea and hypotension has proven refractory to 5 L IV fluid.  He reports he does have a history of hepatitis.  His last IV drug abuse was 2 days prior to admission he started Suboxone 4 days prior to admission.  Pulmonary critical care asked to admit due to his refractory hypertension.  Notes some prolonged attempted but due to his anxiety he is unable to tolerate procedure.    Pertinent  Medical History   Past Medical History:  Diagnosis Date   Asthma    Depression    Heroin abuse      Significant Hospital Events: Including procedures, antibiotic start and stop dates in addition to other pertinent events   10/20/2018 4 suture line attempted aborted due to anxiety and the patient.  Interim History / Subjective:  On low dose levophed this morning. Weaned off at 8 am Having nausea and emesis Refusing suboxone Feeling agitated. Wants to leave. Staff has advised against this  Objective   Blood pressure 119/81, pulse 73, temperature 97.8 F (36.6 C), temperature source Oral, resp. rate 18, height 6\' 2"  (1.88 m), weight 74.9 kg, SpO2 98 %.        Intake/Output Summary (Last 24 hours) at 10/21/2022 0958 Last data filed at 10/21/2022 0900 Gross per 24 hour  Intake 5402.96 ml  Output 3025 ml  Net 2377.96 ml   Filed Weights   10/19/22 1911 10/20/22 1043 10/21/22 0434  Weight: 74.8 kg 79.4 kg 74.9 kg   Physical Exam: General: Well-appearing, no acute distress HENT: [place], AT, OP clear, MMM Eyes: EOMI, no scleral icterus Respiratory: Clear to auscultation bilaterally.  No crackles, wheezing or rales Cardiovascular: RRR,  -M/R/G, no JVD GI: BS+, soft, nontender Extremities:-Edema,-tenderness Neuro: AAO x4, CNII-XII grossly intact Skin: Intact, no rashes or bruising Psych: Anxious mood, normal affect  No labs this am LA 4/24 2.4 Improving AKI from 1.53>1.31 Blood cultures NGTD with final pending GIP negative  Procal 38  Resolved Hospital Problem list   Acute toxic metabolic encephalopathy  Assessment & Plan:  Septic +/-hypovolemic shock Lactic acidosis Elevated procal in known IVDU who recently started subxone. S/p IVF 6L in last 24 hours. GI work-up negative so far --Continue Vanc/Zosyn. F/u cultures --Trend LA --Maintenance fluids --Echo ordered  IVDU with suspected withdrawal History of hepatitis N/V/D associated with withdrawal HIV neg --Suboxone ordered --Zofran ordered  Acute kidney injury - improving --Fluid resuscitation --Monitor creatinine  Anxiety --May need anxiolytics and narcotics tolerated the procedures.  Patient advised against AMA for further work-up of his critical illness  Best Practice (right click and "Reselect all SmartList Selections" daily)   Diet/type: NPO DVT prophylaxis: not indicated GI prophylaxis: PPI Lines: N/A Foley:  N/A Code Status:  full code Last date of multidisciplinary goals of care discussion [tbd]  Critical care time: 46 min   The patient is critically ill with multiple organ systems failure and requires high complexity decision making for assessment and support, frequent evaluation and titration of therapies, application of advanced monitoring technologies and extensive interpretation of multiple databases.  Independent Critical Care Time: 45 Minutes.   Mechele Collin, M.D. [facility]monary/Critical Care Medicine 10/21/2022 9:58 AM  Please see Amion for pager number to reach on-call Pulmonary and Critical Care Team.

## 2022-10-21 NOTE — Progress Notes (Signed)
   10/21/22 1600  Spiritual Encounters  Type of Visit Attempt (pt unavailable)  Care provided to: Patient  Referral source Physician  Reason for visit Routine spiritual support   Ch attempted to visit pt. He was sleeping and the bedside nurse said, "let him sleep." Ch will follow-up tomorrow.

## 2022-10-21 NOTE — Progress Notes (Signed)
Spoke with Dr. Everardo All in person and made her aware that patient is getting really restless with some agitation, c/o nausea and stating he needs to leave and has somewhere to go. Also made MD aware that patient has been refusing suboxone and that patient informed nurse yesterday that he has some sort of abscess in his mouth that he occasionally pops and puss comes out. Patient does not want to discuss mouth sore. MD acknowledged and went and spoke with patient.

## 2022-10-21 NOTE — Progress Notes (Signed)
Brief Nutrition Note  Pt with excessive amounts of stool related to withdrawal. Refusing suboxone at this time.   Discussed with RN in rounds. Will add banatrol BID which can be taken orally and may help add bulk to stools and decrease frequency.  Will not follow at this time, but if acute nutrition needs arise please submit a consult.   Greig Castilla, RD, LDN Clinical Dietitian RD pager # available in AMION  After hours/weekend pager # available in [facility]

## 2022-10-21 NOTE — Progress Notes (Signed)
eLink Physician-Brief Progress Note Patient Name: Chad James DOB: 10/30/1990 MRN: [id number]   Date of Service  10/21/2022  HPI/Events of Note  32 yr old male with hx of IVDA transferred to ICU with hypotension.  Presently not on vasopressors.  On empiric abx.  On RA oxygen.  eICU Interventions  Chart reviewed     Intervention Category Evaluation Type: New Patient Evaluation  Henry Russel, P 10/21/2022, 11:11 PM

## 2022-10-22 ENCOUNTER — Inpatient Hospital Stay (HOSPITAL_COMMUNITY): Payer: Medicaid Other

## 2022-10-22 DIAGNOSIS — I38 Endocarditis, valve unspecified: Secondary | ICD-10-CM

## 2022-10-22 LAB — ECHOCARDIOGRAM COMPLETE
Area-P 1/2: 3.36 cm2
Calc EF: 56.5 %
Height: 74 in
S' Lateral: 3.7 cm
Single Plane A2C EF: 58.2 %
Single Plane A4C EF: 54.2 %
Weight: 2571.45 oz

## 2022-10-22 LAB — CBC
HCT: 35.5 % — ABNORMAL LOW (ref 39.0–52.0)
Hemoglobin: 11.6 g/dL — ABNORMAL LOW (ref 13.0–17.0)
MCH: 27.9 pg (ref 26.0–34.0)
MCHC: 32.7 g/dL (ref 30.0–36.0)
MCV: 85.3 fL (ref 80.0–100.0)
Platelets: 138 10*3/uL — ABNORMAL LOW (ref 150–400)
RBC: 4.16 MIL/uL — ABNORMAL LOW (ref 4.22–5.81)
RDW: 14 % (ref 11.5–15.5)
WBC: 5.9 10*3/uL (ref 4.0–10.5)
nRBC: 0 % (ref 0.0–0.2)

## 2022-10-22 LAB — PHOSPHORUS: Phosphorus: 3 mg/dL (ref 2.5–4.6)

## 2022-10-22 LAB — BASIC METABOLIC PANEL
Anion gap: 9 (ref 5–15)
BUN: 6 mg/dL (ref 6–20)
CO2: 22 mmol/L (ref 22–32)
Calcium: 8.4 mg/dL — ABNORMAL LOW (ref 8.9–10.3)
Chloride: 111 mmol/L (ref 98–111)
Creatinine, Ser: 0.9 mg/dL (ref 0.61–1.24)
GFR, Estimated: >60 mL/min (ref >60)
Glucose, Bld: 107 mg/dL — ABNORMAL HIGH (ref 70–99)
Potassium: 3.8 mmol/L (ref 3.5–5.1)
Sodium: 142 mmol/L (ref 135–145)

## 2022-10-22 LAB — VANCOMYCIN, PEAK: Vancomycin Pk: 31 ug/mL (ref 30–40)

## 2022-10-22 LAB — VANCOMYCIN, TROUGH: Vancomycin Tr: 11 ug/mL — ABNORMAL LOW (ref 15–20)

## 2022-10-22 LAB — CULTURE, BLOOD (ROUTINE X 2)

## 2022-10-22 LAB — STOOL CULTURE REFLEX - RSASHR

## 2022-10-22 LAB — MAGNESIUM: Magnesium: 2 mg/dL (ref 1.7–2.4)

## 2022-10-22 LAB — STOOL CULTURE

## 2022-10-22 MED ORDER — SODIUM CHLORIDE 0.9 % IV BOLUS
250.0000 mL | Freq: Once | INTRAVENOUS | Status: AC
  Administered 2022-10-22: 250 mL via INTRAVENOUS

## 2022-10-22 MED ORDER — ALPRAZOLAM 0.5 MG PO TABS
0.5000 mg | ORAL_TABLET | Freq: Once | ORAL | Status: AC
  Administered 2022-10-22: 0.5 mg via ORAL
  Filled 2022-10-22: qty 1

## 2022-10-22 NOTE — Progress Notes (Signed)
[facility] Andrez Grime MD was informed of patient bp 95/50 and was scheduled to give Suboxone 0.5 2 tabs was ordered to hold medication to night and give bolus 250 mg of Normal Saline

## 2022-10-22 NOTE — Progress Notes (Signed)
NAME:  Chad James, MRN:  [id number], DOB:  04-11-1991, LOS: 2 ADMISSION DATE:  10/19/2022, CONSULTATION DATE: 10/20/2022 REFERRING MD: Emergency department physician, CHIEF COMPLAINT: Shock  History of Present Illness:  32 year old male with a history of IV drug abuse for approximately 10 years with heroin was preferred substance. Presents with nausea vomiting diarrhea and hypotension has proven refractory to 5 L IV fluid.  He reports he does have a history of hepatitis.  His last IV drug abuse was 2 days prior to admission he started Suboxone 4 days prior to admission.  Pulmonary critical care asked to admit due to his refractory hypertension.  Notes some prolonged attempted but due to his anxiety he is unable to tolerate procedure.    Pertinent  Medical History   Past Medical History:  Diagnosis Date   Asthma    Depression    Heroin abuse (HCC)      Significant Hospital Events: Including procedures, antibiotic start and stop dates in addition to other pertinent events   10/20/2018 4 suture line attempted aborted due to anxiety and the patient. 4/24 blood cultures >> no growth to date >>   Interim History / Subjective:  Significant nausea, difficulty tolerating p.o. Room air Norepinephrine weaned to off Refused Suboxone Wants to go home   Objective   Blood pressure (!) 119/91, pulse (!) 59, temperature 98.3 F (36.8 C), temperature source Oral, resp. rate 15, height 6\' 2"  (1.88 m), weight 72.9 kg, SpO2 96 %.        Intake/Output Summary (Last 24 hours) at 10/22/2022 1031 Last data filed at 10/22/2022 1000 Gross per 24 hour  Intake 4380.26 ml  Output --  Net 4380.26 ml   Filed Weights   10/20/22 1043 10/21/22 0434 10/22/22 0444  Weight: 79.4 kg 74.9 kg 72.9 kg   Physical Exam: General: Young man, no distress, sitting up in bed HENT: Oropharynx clear, strong voice, no secretions Respiratory: Clear bilaterally, no crackles, no wheezes Cardiovascular: Regular,  distant, no murmur GI: Soft, nondistended with positive bowel sounds Extremities: No edema Neuro: Awake, alert, answers questions, follows commands.  Good strength Skin: No lesions, no rash Psych: Somewhat anxious, normal affect    Resolved Hospital Problem list     Assessment & Plan:  Septic +/-hypovolemic shock Elevated procal in known IVDU who recently started subxone. S/p IVF 6L in last 24 hours. GI work-up negative so far -Norepinephrine weaned to off -Remains on empiric vancomycin and Zosyn.  MRSA screen negative.  Plan to continue both pending final blood culture data -Echocardiogram not yet done, will order -Maintenance IV fluids  IVDU with suspected withdrawal History of hepatitis N/V/D associated with withdrawal HIV neg -Has refused Suboxone, it is still ordered -Zofran available , P.o. diet as he can tolerate  Acute kidney injury - improving -Continue warm resuscitation -Follow BMP, urine output.  Recheck labs 4/26 afternoon  Anxiety -Holding off on anxiolytics, narcotics for now  Disposition -The patient wants to leave, suspect he may decide to leave AMA.  Have encouraged him to allow [place] to continue workup to confirm no evidence of bacteremia, septic shock.  Best Practice (right click and "Reselect all SmartList Selections" daily)   Diet/type: Regular consistency (see orders) DVT prophylaxis: not indicated GI prophylaxis: PPI Lines: N/A Foley:  N/A Code Status:  full code Last date of multidisciplinary goals of care discussion [tbd]  Critical care time: 31 min   The patient is critically ill with multiple organ systems failure and requires  high complexity decision making for assessment and support, frequent evaluation and titration of therapies, application of advanced monitoring technologies and extensive interpretation of multiple databases.  Independent Critical Care Time: 31 minutes.   Levy Pupa, MD, PhD 10/22/2022, 10:32 AM [facility] [phone] or if no answer before 7:00PM call [phone] For any issues after 7:00PM please call eLink [phone]

## 2022-10-22 NOTE — Progress Notes (Signed)
   10/22/22 1200  Spiritual Encounters  Type of Visit Follow up  Care provided to: Patient  Referral source Chaplain team  Reason for visit Routine spiritual support  OnCall Visit No  Spiritual Framework  Presenting Themes Meaning/purpose/sources of inspiration  Values/beliefs faith  Community/Connection Family  Patient Stress Factors Health changes;Lack of knowledge  Interventions  Spiritual Care Interventions Made Reflective listening;Compassionate presence   Ch followed-up with pt. Pt is stress out because of lack of knowledge. He said he is trying to figure out what is happening to him but he is not getting enough information. He is concern about his dog, car, and job. Ch conveyed a calming presence and tried to lessen pt's anxiety. No follow-up needed at this time.

## 2022-10-22 NOTE — Progress Notes (Signed)
Echocardiogram 2D Echocardiogram has been performed.  Warren Lacy Keelyn Monjaras RDCS 10/22/2022, 3:40 PM

## 2022-10-22 NOTE — Progress Notes (Signed)
Pharmacy Antibiotic Note  Chad James is a 32 y.o. male admitted on 10/19/2022 with bacteremia.  Pharmacy has been consulted for Vancomycin dosing.  Patient inintial presented with AKI sCr (1.53) but now appears to be back at baseline. Calculated 2 level AUC 522 on the current dose.   Plan: Continue Vancomycin 1000mg  IV Q12H   Zosyn 3.375g Q8H  Height: 6\' 2"  (188 cm) Weight: 72.9 kg (160 lb 11.5 oz) IBW/kg (Calculated) : 82.2  Temp (24hrs), Avg:97.8 F (36.6 C), Min:97.5 F (36.4 C), Max:98.3 F (36.8 C)  Recent Labs  Lab 10/19/22 2030 10/20/22 0926 10/20/22 1029 10/20/22 1208 10/20/22 1541 10/22/22 1152 10/22/22 2135  WBC 3.4* 10.6*  --   --   --  5.9  --   CREATININE 1.53* 1.31*  --   --   --  0.90  --   LATICACIDVEN  --  2.6* 1.6 2.6* 2.4*  --   --   VANCOTROUGH  --   --   --   --   --   --  11*  VANCOPEAK  --   --   --   --   --  31  --      Estimated Creatinine Clearance: 122.6 mL/min (by C-G formula based on SCr of 0.9 mg/dL).    No Known Allergies   Thank you for allowing pharmacy to be a part of this patient's care.  Ruben Im, PharmD Clinical Pharmacist 10/22/2022 11:46 PM Please check AMION for all [facility] Pharmacy numbers

## 2022-10-23 ENCOUNTER — Inpatient Hospital Stay (HOSPITAL_COMMUNITY): Payer: Medicaid Other

## 2022-10-23 DIAGNOSIS — N179 Acute kidney failure, unspecified: Secondary | ICD-10-CM | POA: Diagnosis not present

## 2022-10-23 LAB — COMPREHENSIVE METABOLIC PANEL
ALT: 32 U/L (ref 0–44)
AST: 29 U/L (ref 15–41)
Albumin: 3 g/dL — ABNORMAL LOW (ref 3.5–5.0)
Alkaline Phosphatase: 60 U/L (ref 38–126)
Anion gap: 9 (ref 5–15)
BUN: 6 mg/dL (ref 6–20)
CO2: 23 mmol/L (ref 22–32)
Calcium: 8.3 mg/dL — ABNORMAL LOW (ref 8.9–10.3)
Chloride: 111 mmol/L (ref 98–111)
Creatinine, Ser: 1.04 mg/dL (ref 0.61–1.24)
GFR, Estimated: >60 mL/min (ref >60)
Glucose, Bld: 125 mg/dL — ABNORMAL HIGH (ref 70–99)
Potassium: 3.5 mmol/L (ref 3.5–5.1)
Sodium: 143 mmol/L (ref 135–145)
Total Bilirubin: 0.7 mg/dL (ref 0.3–1.2)
Total Protein: 6.2 g/dL — ABNORMAL LOW (ref 6.5–8.1)

## 2022-10-23 LAB — URINALYSIS, ROUTINE W REFLEX MICROSCOPIC
Bilirubin Urine: NEGATIVE
Glucose, UA: NEGATIVE mg/dL
Hgb urine dipstick: NEGATIVE
Ketones, ur: NEGATIVE mg/dL
Leukocytes,Ua: NEGATIVE
Nitrite: NEGATIVE
Protein, ur: NEGATIVE mg/dL
Specific Gravity, Urine: >1.046 — ABNORMAL HIGH (ref 1.005–1.030)
pH: 5 (ref 5.0–8.0)

## 2022-10-23 LAB — C-REACTIVE PROTEIN: CRP: 4.6 mg/dL — ABNORMAL HIGH (ref <1.0)

## 2022-10-23 LAB — CBC
HCT: 36.3 % — ABNORMAL LOW (ref 39.0–52.0)
Hemoglobin: 11.8 g/dL — ABNORMAL LOW (ref 13.0–17.0)
MCH: 27.8 pg (ref 26.0–34.0)
MCHC: 32.5 g/dL (ref 30.0–36.0)
MCV: 85.4 fL (ref 80.0–100.0)
Platelets: 161 10*3/uL (ref 150–400)
RBC: 4.25 MIL/uL (ref 4.22–5.81)
RDW: 13.9 % (ref 11.5–15.5)
WBC: 4.8 10*3/uL (ref 4.0–10.5)
nRBC: 0 % (ref 0.0–0.2)

## 2022-10-23 LAB — PROCALCITONIN: Procalcitonin: 3.4 ng/mL

## 2022-10-23 LAB — TSH: TSH: 1.965 u[IU]/mL (ref 0.350–4.500)

## 2022-10-23 LAB — MAGNESIUM: Magnesium: 1.9 mg/dL (ref 1.7–2.4)

## 2022-10-23 LAB — CORTISOL: Cortisol, Plasma: 15.2 ug/dL

## 2022-10-23 LAB — BRAIN NATRIURETIC PEPTIDE: B Natriuretic Peptide: 357.9 pg/mL — ABNORMAL HIGH (ref 0.0–100.0)

## 2022-10-23 LAB — CULTURE, BLOOD (ROUTINE X 2): Culture: NO GROWTH

## 2022-10-23 MED ORDER — HEPARIN SODIUM (PORCINE) 5000 UNIT/ML IJ SOLN
5000.0000 [IU] | Freq: Three times a day (TID) | INTRAMUSCULAR | Status: DC
  Administered 2022-10-23 – 2022-10-26 (×8): 5000 [IU] via SUBCUTANEOUS
  Filled 2022-10-23 (×6): qty 1

## 2022-10-23 MED ORDER — IOHEXOL 350 MG/ML SOLN
75.0000 mL | Freq: Once | INTRAVENOUS | Status: AC | PRN
  Administered 2022-10-23: 75 mL via INTRAVENOUS

## 2022-10-23 MED ORDER — SODIUM CHLORIDE 0.9 % IV SOLN: INTRAVENOUS | Status: AC

## 2022-10-23 MED ORDER — GABAPENTIN 300 MG PO CAPS
300.0000 mg | ORAL_CAPSULE | Freq: Every day | ORAL | Status: DC
  Administered 2022-10-23 – 2022-10-25 (×3): 300 mg via ORAL
  Filled 2022-10-23 (×3): qty 1

## 2022-10-23 NOTE — H&P (Signed)
PROGRESS NOTE                                                                                                                                                                                                             Patient Demographics:    Chad James, is a 32 y.o. 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   Subjective:    Chad James today has, No headache, No chest pain, No abdominal pain - No Nausea, No new weakness tingling or numbness, no SOB.   Assessment  & Plan :   Hypotension caused by combination of sepsis and hypovolemia due to recent nausea vomiting - he has recent history of IV drug use, presented with nausea vomiting and abdominal pain with severe hypotension and admitted to ICU.  Upon admission procalcitonin was  greater than 35, CRP was greater than 15, blood cultures however remain negative, has been on empiric IV antibiotics since admission, initially required multiple liters of IV fluid and pressors for few days, sepsis has  now resolved.  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  No other clear source of infection so far could be identified but he is high risk for occult endocarditis due to his recent multiple year positive history of IV heroin use.  Continue to monitor cultures.   IV drug use.  Used heroin for multiple years until few weeks ago, currently on Suboxone this was started outpatient.  Nausea vomiting and abdominal pain upon admission.  This has completely resolved will monitor.  Dehydration AKI.  Resolved after IV fluids.      Condition - Fair  Family Communication  :  None  Code Status :  Ful  Consults  :  PCCM  PUD Prophylaxis :    Procedures  :     CT Maxillo facial -  CT Head - Non acute  TTE - 1. Left ventricular ejection fraction, by estimation, is 50 to 55%. The left ventricle has low normal function. The left ventricle has no regional wall motion abnormalities. Left ventricular diastolic parameters were normal.  2. Right ventricular systolic function is normal. The right ventricular size is normal. Tricuspid regurgitation signal is inadequate for assessing PA pressure.  3. The mitral valve is grossly normal. No evidence of mitral valve regurgitation.  4. The aortic valve is tricuspid. Aortic valve regurgitation is not visualized.  5. The inferior vena cava is dilated in size with <50% respiratory variability, suggesting right atrial pressure of 15 mmHg      Disposition Plan  :    Status is: Inpatient   DVT Prophylaxis  :  Heparin  SCDs Start: 10/20/22 0844 SCDs Start: 10/20/22 0612   Lab Results  Component Value Date   PLT 161 10/23/2022    Diet :  Diet Order             Diet regular Room service appropriate? Yes; Fluid consistency: Thin  Diet effective now                    Inpatient Medications  Scheduled Meds:   buprenorphine-naloxone  2 tablet Sublingual BID   Chlorhexidine Gluconate Cloth  6 each Topical Daily   fiber supplement (BANATROL TF)  60 mL Oral BID   nicotine  21 mg Transdermal Daily   Continuous Infusions:  sodium chloride 75 mL/hr at 10/23/22 0639   piperacillin-tazobactam (ZOSYN)  IV 3.375 g (10/23/22 0842)   vancomycin 1,000 mg (10/23/22 0847)   PRN Meds:.acetaminophen, docusate sodium, melatonin, ondansetron (ZOFRAN) IV, mouth rinse    Objective:   Vitals:   10/22/22 2108 10/22/22 2332 10/23/22 0353 10/23/22 0600  BP: (!) 95/50 (!) 93/58 109/69   Pulse:  64 (!) 51   Resp:  16 18   Temp:  97.7 F (36.5 C) 97.6 F (36.4 C)   TempSrc:  Oral Oral   SpO2:  95% 95%   Weight:   83.6 kg 83.1 kg  Height:        Wt Readings from Last 3 Encounters:  10/23/22 83.1 kg  04/14/20 74.8 kg  08/08/19 74.8 kg     Intake/Output Summary (Last 24 hours) at 10/23/2022 0900 Last data filed at 10/22/2022 1700  Gross per 24 hour  Intake 1483.08 ml  Output 525 ml  Net 958.08 ml     Physical Exam  Awake Alert, No new F.N deficits, Normal affect .AT,PERRAL Supple Neck, No JVD,   Symmetrical Chest wall movement, Good air movement bilaterally, CTAB RRR,No Gallops,Rubs or new Murmurs,  +ve B.Sounds, Abd Soft, No tenderness,   Multiple track marks all over his extremities upper and lower     Data Review:    Recent Labs  Lab 10/19/22 2030 10/20/22 0926 10/22/22 1152 10/23/22 0733  WBC 3.4* 10.6* 5.9 4.8  HGB 12.3* 9.6* 11.6* 11.8*  HCT 38.1* 30.3* 35.5* 36.3*  PLT 136* 104* 138* 161  MCV 84.9 88.3 85.3 85.4  MCH 27.4 28.0 27.9 27.8  MCHC 32.3 31.7 32.7 32.5  RDW 13.5 14.3 14.0 13.9  LYMPHSABS 0.2* 0.4*  --   --   MONOABS 0.0* 0.1  --   --   EOSABS 0.0 0.0  --   --   BASOSABS 0.0 0.0  --   --     Recent Labs  Lab 10/19/22 2030 10/20/22 0926 10/20/22 1029 10/20/22 1208 10/20/22 1541 10/22/22 1152 10/23/22 0733  NA 135 138  --   --   --  142 143  K 3.3* 4.3   --   --   --  3.8 3.5  CL 99 107  --   --   --  111 111  CO2 22 23  --   --   --  22 23  ANIONGAP 14 8  --   --   --  9 9  GLUCOSE 100* 121*  --   --   --  107* 125*  BUN 15 17  --   --   --  6 6  CREATININE 1.53* 1.31*  --   --   --  0.90 1.04  AST 54* 41  --   --   --   --  29  ALT 54* 37  --   --   --   --  32  ALKPHOS 119 74  --   --   --   --  60  BILITOT 2.2* 0.7  --   --   --   --  0.7  ALBUMIN 3.7 2.5*  --   --   --   --  3.0*  CRP  --  15.0*  --   --   --   --  4.6*  PROCALCITON  --  38.08  --   --   --   --   --   LATICACIDVEN  --  2.6* 1.6 2.6* 2.4*  --   --   TSH  --   --   --   --   --   --  1.965  BNP  --   --   --   --   --   --  357.9*  MG  --   --   --   --   --  2.0 1.9  CALCIUM 8.9 7.7*  --   --   --  8.4* 8.3*      Recent Labs  Lab 10/19/22 2030 10/20/22 0926 10/20/22 1029 10/20/22 1208 10/20/22 1541 10/22/22 1152 10/23/22 0733  CRP  --  15.0*  --   --   --   --  4.6*  PROCALCITON  --  38.08  --   --   --   --   --  LATICACIDVEN  --  2.6* 1.6 2.6* 2.4*  --   --   TSH  --   --   --   --   --   --  1.965  BNP  --   --   --   --   --   --  357.9*  MG  --   --   --   --   --  2.0 1.9  CALCIUM 8.9 7.7*  --   --   --  8.4* 8.3*    Recent Labs  Lab 10/19/22 2030 10/20/22 0926 10/20/22 1029 10/20/22 1208 10/20/22 1541 10/22/22 1152 10/23/22 0733  WBC 3.4* 10.6*  --   --   --  5.9 4.8  PLT 136* 104*  --   --   --  138* 161  CRP  --  15.0*  --   --   --   --  4.6*  PROCALCITON  --  38.08  --   --   --   --   --   LATICACIDVEN  --  2.6* 1.6 2.6* 2.4*  --   --   CREATININE 1.53* 1.31*  --   --   --  0.90 1.04    ------------------------------------------------------------------------------------------------------------------ No results found for: "CHOL", "HDL", "LDLCALC", "LDLDIRECT", "TRIG", "CHOLHDL"  No results found for: "HGBA1C"  Recent Labs    10/23/22 0733  TSH 1.965     Radiology Reports DG Chest Port 1 View  Result Date:  10/23/2022 CLINICAL DATA:  Shortness of breath. EXAM: PORTABLE CHEST 1 VIEW COMPARISON:  One view chest x-ray 10/19/2022 FINDINGS: The heart size is normal. New bilateral lower lobe airspace disease is present, right greater than left. Lung volumes are low. The visualized soft tissues and bony thorax are unremarkable. IMPRESSION: New bilateral lower lobe airspace disease, right greater than left is concerning for pneumonia. Electronically Signed   By: Marin Roberts M.D.   On: 10/23/2022 08:11   ECHOCARDIOGRAM COMPLETE  Result Date: 10/22/2022    ECHOCARDIOGRAM REPORT   Patient Name:   Chad James Date of Exam: 10/22/2022 Medical Rec #:  [id number]     Height:       74.0 in Accession #:    [id number]    Weight:       160.7 lb Date of Birth:  07/09/1990     BSA:          1.981 m Patient Age:    31 years      BP:           109/74 mmHg Patient Gender: M             HR:           46 bpm. Exam Location:  Inpatient Procedure: 2D Echo, Color Doppler and Cardiac Doppler Indications:    Endocarditis  History:        Patient has no prior history of Echocardiogram examinations.                 Risk Factors:History of polysubstance abuse.  Sonographer:    Irving Burton Senior RDCS Referring Phys: [id number] ROBERT S BYRUM IMPRESSIONS  1. Left ventricular ejection fraction, by estimation, is 50 to 55%. The left ventricle has low normal function. The left ventricle has no regional wall motion abnormalities. Left ventricular diastolic parameters were normal.  2. Right ventricular systolic function is normal. The right ventricular size is normal. Tricuspid regurgitation signal is inadequate for assessing PA pressure.  3. The mitral valve is grossly normal. No evidence of mitral valve regurgitation.  4. The aortic valve is tricuspid. Aortic valve regurgitation is not visualized.  5. The inferior vena cava is dilated in size with <50% respiratory variability, suggesting right atrial pressure of 15 mmHg. FINDINGS  Left Ventricle: Left  ventricular ejection fraction, by estimation, is 50 to 55%. The left ventricle has low normal function. The left ventricle has no regional wall motion abnormalities. The left ventricular internal cavity size was normal in size. There is no left ventricular hypertrophy. Left ventricular diastolic parameters were normal. Right Ventricle: The right ventricular size is normal. No increase in right ventricular wall thickness. Right ventricular systolic function is normal. Tricuspid regurgitation signal is inadequate for assessing PA pressure. Left Atrium: Left atrial size was normal in size. Right Atrium: Right atrial size was normal in size. Pericardium: Trivial pericardial effusion is present. Mitral Valve: The mitral valve is grossly normal. No evidence of mitral valve regurgitation. Tricuspid Valve: The tricuspid valve is grossly normal. Tricuspid valve regurgitation is trivial. Aortic Valve: The aortic valve is tricuspid. Aortic valve regurgitation is not visualized. Pulmonic Valve: The pulmonic valve was grossly normal. Pulmonic valve regurgitation is not visualized. Aorta: The aortic root and ascending aorta are structurally normal, with no evidence of dilitation. Venous: The inferior vena cava is dilated in size with less than 50% respiratory variability, suggesting right atrial pressure of 15 mmHg. IAS/Shunts: No atrial level shunt detected by color flow Doppler.  LEFT VENTRICLE PLAX 2D LVIDd:         5.10 cm      Diastology LVIDs:         3.70 cm      LV e' medial:    13.30 cm/s LV PW:         0.80 cm      LV E/e' medial:  8.0 LV IVS:        0.80 cm      LV e' lateral:   14.50 cm/s LVOT diam:     2.00 cm      LV E/e' lateral: 7.4 LV SV:         79 LV SV Index:   40 LVOT Area:     3.14 cm  LV Volumes (MOD) LV vol d, MOD A2C: 113.0 ml LV vol d, MOD A4C: 111.0 ml LV vol s, MOD A2C: 47.2 ml LV vol s, MOD A4C: 50.8 ml LV SV MOD A2C:     65.8 ml LV SV MOD A4C:     111.0 ml LV SV MOD BP:      63.8 ml RIGHT VENTRICLE  RV S prime:     12.50 cm/s TAPSE (M-mode): 2.0 cm LEFT ATRIUM             Index        RIGHT ATRIUM           Index LA diam:        3.60 cm 1.82 cm/m   RA Area:     12.90 cm LA Vol (A2C):   40.7 ml 20.55 ml/m  RA Volume:   26.90 ml  13.58 ml/m LA Vol (A4C):   53.2 ml 26.86 ml/m LA Biplane Vol: 49.5 ml 24.99 ml/m  AORTIC VALVE LVOT Vmax:   114.00 cm/s LVOT Vmean:  89.500 cm/s LVOT VTI:    0.252 m  AORTA Ao Root diam: 3.20 cm MITRAL VALVE MV Area (PHT): 3.36 cm     SHUNTS  MV Decel Time: 226 msec     Systemic VTI:  0.25 m MV E velocity: 107.00 cm/s  Systemic Diam: 2.00 cm MV A velocity: 38.30 cm/s MV E/A ratio:  2.79 Carolan Clines Electronically signed by Carolan Clines Signature Date/Time: 10/22/2022/4:07:52 PM    Final    CT Head Wo Contrast  Result Date: 10/19/2022 CLINICAL DATA:  Delirium EXAM: CT HEAD WITHOUT CONTRAST TECHNIQUE: Contiguous axial images were obtained from the base of the skull through the vertex without intravenous contrast. RADIATION DOSE REDUCTION: This exam was performed according to the departmental dose-optimization program which includes automated exposure control, adjustment of the mA and/or kV according to patient size and/or use of iterative reconstruction technique. COMPARISON:  10/06/2019 FINDINGS: Brain: No acute intracranial abnormality. Specifically, no hemorrhage, hydrocephalus, mass lesion, acute infarction, or significant intracranial injury. Vascular: No hyperdense vessel or unexpected calcification. Skull: No acute calvarial abnormality. Sinuses/Orbits: No acute findings Other: None IMPRESSION: Normal study. Electronically Signed   By: Charlett Nose M.D.   On: 10/19/2022 21:21   DG Chest Portable 1 View  Result Date: 10/19/2022 CLINICAL DATA:  OD, altered mental status. EXAM: PORTABLE CHEST 1 VIEW COMPARISON:  04/14/2020. FINDINGS: The heart size and mediastinal contours are within normal limits. The pulmonary vasculature is distended. No consolidation, effusion, or  pneumothorax. No acute osseous abnormality. IMPRESSION: Distended pulmonary vasculature. Electronically Signed   By: Thornell Sartorius M.D.   On: 10/19/2022 20:45      Signature  -   Susa Raring M.D on 10/23/2022 at 9:00 AM   -  To page go to [email or web address]

## 2022-10-23 NOTE — Evaluation (Signed)
Physical Therapy Evaluation and Discharge Patient Details Name: Chad James MRN: [id number] DOB: 07-19-1990 Today's Date: 10/23/2022  History of Present Illness  4M with history of IV drug use who presented 10/20/22 with nausea, vomiting and diarrhea. +hypotensive needing 6L of fluids; encephalopathy, AKI;  Clinical Impression   Patient evaluated by Physical Therapy with no further acute PT needs identified. All education has been completed and the patient has no further questions. Patient is independent with all mobility. PT is signing off. Thank you for this referral.        Recommendations for follow up therapy are one component of a multi-disciplinary discharge planning process, led by the attending physician.  Recommendations may be updated based on patient status, additional functional criteria and insurance authorization.  Follow Up Recommendations       Assistance Recommended at Discharge None  Patient can return home with the following       Equipment Recommendations None recommended by PT  Recommendations for Other Services       Functional Status Assessment Patient has not had a recent decline in their functional status     Precautions / Restrictions Precautions Precautions: Other (comment) Precaution Comments: hypotension      Mobility  Bed Mobility Overal bed mobility: Independent                  Transfers Overall transfer level: Independent Equipment used: None                    Ambulation/Gait Ambulation/Gait assistance: Independent Gait Distance (Feet): 200 Feet Assistive device: None Gait Pattern/deviations: WFL(Within Functional Limits)   Gait velocity interpretation: >4.37 ft/sec, indicative of normal walking speed   General Gait Details: good velocity; no imbalance  Stairs            Wheelchair Mobility    Modified Rankin (Stroke Patients Only)       Balance Overall balance assessment: Independent                                            Pertinent Vitals/Pain Pain Assessment Pain Assessment: No/denies pain    Home Living Family/patient expects to be discharged to:: Private residence Living Arrangements: Other relatives (grandmother) Available Help at Discharge: Family;Available 24 hours/day Type of Home: House Home Access: Level entry       Home Layout: One level Home Equipment: None      Prior Function Prior Level of Function : Independent/Modified Independent                     Hand Dominance        Extremity/Trunk Assessment   Upper Extremity Assessment Upper Extremity Assessment: Overall WFL for tasks assessed    Lower Extremity Assessment Lower Extremity Assessment: Overall WFL for tasks assessed    Cervical / Trunk Assessment Cervical / Trunk Assessment: Normal  Communication   Communication: No difficulties  Cognition Arousal/Alertness: Awake/alert Behavior During Therapy: WFL for tasks assessed/performed Overall Cognitive Status: Within Functional Limits for tasks assessed                                          General Comments      Exercises     Assessment/Plan    PT  Assessment Patient does not need any further PT services  PT Problem List         PT Treatment Interventions      PT Goals (Current goals can be found in the Care Plan section)  Acute Rehab PT Goals PT Goal Formulation: All assessment and education complete, DC therapy    Frequency       Co-evaluation               AM-PAC PT "6 Clicks" Mobility  Outcome Measure Help needed turning from your back to your side while in a flat bed without using bedrails?: None Help needed moving from lying on your back to sitting on the side of a flat bed without using bedrails?: None Help needed moving to and from a bed to a chair (including a wheelchair)?: None Help needed standing up from a chair using your arms (e.g., wheelchair or  bedside chair)?: None Help needed to walk in hospital room?: None Help needed climbing 3-5 steps with a railing? : None 6 Click Score: 24    End of Session   Activity Tolerance: Patient tolerated treatment well Patient left: in bed;with call bell/phone within reach (pt reports going to bathroom on his own; bed alarm not on on arrival) Nurse Communication: Mobility status PT Visit Diagnosis: Difficulty in walking, not elsewhere classified (R26.2)    Time: 4098-1191 PT Time Calculation (min) (ACUTE ONLY): 11 min   Charges:   PT Evaluation $PT Eval Low Complexity: 1 Low           Jerolyn Center, PT Acute Rehabilitation Services  Office [phone]   Zena Amos 10/23/2022, 10:25 AM

## 2022-10-24 DIAGNOSIS — N179 Acute kidney failure, unspecified: Secondary | ICD-10-CM | POA: Diagnosis not present

## 2022-10-24 LAB — CBC WITH DIFFERENTIAL/PLATELET
Abs Immature Granulocytes: 0.07 10*3/uL (ref 0.00–0.07)
Basophils Absolute: 0 10*3/uL (ref 0.0–0.1)
Basophils Relative: 1 %
Eosinophils Absolute: 0.1 10*3/uL (ref 0.0–0.5)
Eosinophils Relative: 1 %
HCT: 31.2 % — ABNORMAL LOW (ref 39.0–52.0)
Hemoglobin: 10.5 g/dL — ABNORMAL LOW (ref 13.0–17.0)
Immature Granulocytes: 1 %
Lymphocytes Relative: 31 %
Lymphs Abs: 1.7 10*3/uL (ref 0.7–4.0)
MCH: 28.1 pg (ref 26.0–34.0)
MCHC: 33.7 g/dL (ref 30.0–36.0)
MCV: 83.4 fL (ref 80.0–100.0)
Monocytes Absolute: 0.3 10*3/uL (ref 0.1–1.0)
Monocytes Relative: 6 %
Neutro Abs: 3.3 10*3/uL (ref 1.7–7.7)
Neutrophils Relative %: 60 %
Platelets: 169 10*3/uL (ref 150–400)
RBC: 3.74 MIL/uL — ABNORMAL LOW (ref 4.22–5.81)
RDW: 13.7 % (ref 11.5–15.5)
WBC: 5.4 10*3/uL (ref 4.0–10.5)
nRBC: 0 % (ref 0.0–0.2)

## 2022-10-24 LAB — COMPREHENSIVE METABOLIC PANEL
ALT: 37 U/L (ref 0–44)
AST: 35 U/L (ref 15–41)
Albumin: 2.6 g/dL — ABNORMAL LOW (ref 3.5–5.0)
Alkaline Phosphatase: 58 U/L (ref 38–126)
Anion gap: 5 (ref 5–15)
BUN: 5 mg/dL — ABNORMAL LOW (ref 6–20)
CO2: 26 mmol/L (ref 22–32)
Calcium: 8.1 mg/dL — ABNORMAL LOW (ref 8.9–10.3)
Chloride: 109 mmol/L (ref 98–111)
Creatinine, Ser: 1.04 mg/dL (ref 0.61–1.24)
GFR, Estimated: >60 mL/min (ref >60)
Glucose, Bld: 97 mg/dL (ref 70–99)
Potassium: 3.1 mmol/L — ABNORMAL LOW (ref 3.5–5.1)
Sodium: 140 mmol/L (ref 135–145)
Total Bilirubin: 0.6 mg/dL (ref 0.3–1.2)
Total Protein: 5.3 g/dL — ABNORMAL LOW (ref 6.5–8.1)

## 2022-10-24 LAB — PROCALCITONIN: Procalcitonin: 1.35 ng/mL

## 2022-10-24 LAB — STOOL CULTURE REFLEX - CMPCXR

## 2022-10-24 LAB — LACTATE DEHYDROGENASE: LDH: 125 U/L (ref 98–192)

## 2022-10-24 LAB — BRAIN NATRIURETIC PEPTIDE: B Natriuretic Peptide: 491.7 pg/mL — ABNORMAL HIGH (ref 0.0–100.0)

## 2022-10-24 LAB — STOOL CULTURE: E coli, Shiga toxin Assay: NEGATIVE

## 2022-10-24 LAB — C-REACTIVE PROTEIN: CRP: 2.9 mg/dL — ABNORMAL HIGH (ref <1.0)

## 2022-10-24 LAB — MAGNESIUM: Magnesium: 1.9 mg/dL (ref 1.7–2.4)

## 2022-10-24 MED ORDER — POTASSIUM CHLORIDE CRYS ER 20 MEQ PO TBCR
40.0000 meq | EXTENDED_RELEASE_TABLET | Freq: Two times a day (BID) | ORAL | Status: AC
  Administered 2022-10-24 (×2): 40 meq via ORAL
  Filled 2022-10-24 (×2): qty 2

## 2022-10-24 MED ORDER — MAGNESIUM SULFATE 2 GM/50ML IV SOLN
2.0000 g | Freq: Once | INTRAVENOUS | Status: AC
  Administered 2022-10-24: 2 g via INTRAVENOUS
  Filled 2022-10-24: qty 50

## 2022-10-24 MED ORDER — ALPRAZOLAM 0.5 MG PO TABS
0.5000 mg | ORAL_TABLET | Freq: Two times a day (BID) | ORAL | Status: DC | PRN
  Administered 2022-10-24 – 2022-10-25 (×3): 0.5 mg via ORAL
  Filled 2022-10-24 (×3): qty 1

## 2022-10-24 NOTE — Progress Notes (Signed)
PROGRESS NOTE                                                                                                                                                                                                             Patient Demographics:    Chad James, is a 32 y.o. 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   Subjective:    [name] today has, No headache, No chest pain, No abdominal pain - No Nausea, No new weakness tingling or numbness, no SOB.   Assessment  & Plan :   Hypotension caused by combination of sepsis and hypovolemia due to recent nausea vomiting - he has recent history of IV drug use, presented with nausea vomiting and abdominal pain with severe hypotension and admitted to ICU.  Upon admission procalcitonin was  greater than 35, CRP was greater than 15, blood cultures however remain negative, has been on empiric IV antibiotics since admission, initially required multiple liters of IV fluid and pressors for few days, sepsis has  now resolved.  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  No other clear source of infection so far could be identified but he is high risk for occult endocarditis due to his recent multiple year positive history of IV heroin use.  Continue to monitor cultures.   IV drug use.  Used heroin for multiple years until few weeks ago, currently on Suboxone this was started outpatient.  Nausea vomiting and abdominal pain upon admission.  This has completely resolved will monitor.  Hypokalemia.  Replaced.    Dehydration AKI.  Resolved after IV fluids.  Single lymph node noted on CT scan 14 x 13 x 11 mm in size, nonspecific.  Outpatient follow-up with general surgery to see if this can be biopsied.      Condition - Fair  Family Communication  :  None  Code Status :  Full  Consults  :  PCCM, dentist  PUD Prophylaxis :    Procedures  :     CT Maxillo facial - 1. Osseous defect with suspected soft tissue lesion in the right anteromedial maxilla, which measures up to 15 mm. This could represent a neoplastic process or atypical infectious process. Correlate with direct visualization and consider sampling. 2. Prominent, rounded left level 1B lymph node measuring up to 14 x 13 x 11 mm, which is nonspecific. 3. Poor dentition with multifocal dental caries.  CT Head - Non acute  TTE - 1. Left ventricular ejection fraction, by estimation, is 50 to 55%. The left ventricle has low normal function. The left ventricle has no regional wall motion abnormalities. Left ventricular diastolic parameters were normal.  2. Right ventricular systolic function is normal. The right ventricular size is normal. Tricuspid regurgitation signal is inadequate for assessing PA pressure.  3. The mitral valve is grossly normal. No evidence of  mitral valve regurgitation.  4. The aortic valve is tricuspid. Aortic valve regurgitation is not visualized.  5. The inferior vena cava is dilated in size with <50% respiratory variability, suggesting right atrial pressure of 15 mmHg      Disposition Plan  :    Status is: Inpatient   DVT Prophylaxis  :  Heparin  heparin injection 5,000 Units Start: 10/23/22 1400 SCDs Start: 10/20/22 0844 SCDs Start: 10/20/22 0612   Lab Results  Component Value Date   PLT 169 10/24/2022    Diet :  Diet Order             Diet regular Room service appropriate? Yes; Fluid consistency: Thin  Diet effective now                    Inpatient Medications  Scheduled Meds:  buprenorphine-naloxone  2 tablet Sublingual BID   Chlorhexidine Gluconate Cloth  6 each Topical Daily   fiber supplement (BANATROL TF)  60 mL Oral BID   gabapentin  300 mg Oral QHS   heparin injection (subcutaneous)  5,000 Units Subcutaneous Q8H   nicotine  21 mg Transdermal Daily   potassium chloride  40 mEq Oral BID   Continuous Infusions:  piperacillin-tazobactam (ZOSYN)  IV 3.375 g (10/24/22 0136)   vancomycin 1,000  mg (10/23/22 2142)   PRN Meds:.acetaminophen, docusate sodium, melatonin, ondansetron (ZOFRAN) IV, mouth rinse    Objective:   Vitals:   10/23/22 1900 10/23/22 2300 10/24/22 0300 10/24/22 0500  BP: (!) 110/56 106/62  121/81  Pulse: 60 (!) 59  (!) 59  Resp: 16 16  17   Temp: 98 F (36.7 C) 97.6 F (36.4 C)  97.7 F (36.5 C)  TempSrc: Oral Oral  Oral  SpO2: 97% 97%  96%  Weight:   94 kg   Height:        Wt Readings from Last 3 Encounters:  10/24/22 94 kg  04/14/20 74.8 kg  08/08/19 74.8 kg     Intake/Output Summary (Last 24 hours) at 10/24/2022 0839 Last data filed at 10/23/2022 2142 Gross per 24 hour  Intake 2182.17 ml  Output 150 ml  Net 2032.17 ml     Physical Exam  Awake Alert, No new F.N deficits, Normal affect [place].AT,PERRAL, R upper palate a soft 1cm fluctuant  mass Supple Neck, No JVD,   Symmetrical Chest wall movement, Good air movement bilaterally, CTAB RRR,No Gallops,Rubs or new Murmurs,  +ve B.Sounds, Abd Soft, No tenderness,   Multiple track marks all over his extremities upper and lower     Data Review:    Recent Labs  Lab 10/19/22 2030 10/20/22 0926 10/22/22 1152 10/23/22 0733 10/24/22 0234  WBC 3.4* 10.6* 5.9 4.8 5.4  HGB 12.3* 9.6* 11.6* 11.8* 10.5*  HCT 38.1* 30.3* 35.5* 36.3* 31.2*  PLT 136* 104* 138* 161 169  MCV 84.9 88.3 85.3 85.4 83.4  MCH 27.4 28.0 27.9 27.8 28.1  MCHC 32.3 31.7 32.7 32.5 33.7  RDW 13.5 14.3 14.0 13.9 13.7  LYMPHSABS 0.2* 0.4*  --   --  1.7  MONOABS 0.0* 0.1  --   --  0.3  EOSABS 0.0 0.0  --   --  0.1  BASOSABS 0.0 0.0  --   --  0.0    Recent Labs  Lab 10/19/22 2030 10/20/22 0926 10/20/22 1029 10/20/22 1208 10/20/22 1541 10/22/22 1152 10/23/22 0733 10/24/22 0234  NA 135 138  --   --   --  142 143 140  K 3.3* 4.3  --   --   --  3.8 3.5 3.1*  CL 99 107  --   --   --  111 111 109  CO2 22 23  --   --   --  22 23 26   ANIONGAP 14 8  --   --   --  9 9 5   GLUCOSE 100* 121*  --   --   --  107* 125* 97  BUN 15 17  --   --   --  6 6 5*  CREATININE 1.53* 1.31*  --   --   --  0.90 1.04 1.04  AST 54* 41  --   --   --   --  29 35  ALT 54* 37  --   --   --   --  32 37  ALKPHOS 119 74  --   --   --   --  60 58  BILITOT 2.2* 0.7  --   --   --   --  0.7 0.6  ALBUMIN 3.7 2.5*  --   --   --   --  3.0* 2.6*  CRP  --  15.0*  --   --   --   --  4.6* 2.9*  PROCALCITON  --  38.08  --   --   --   --  3.40 1.35  LATICACIDVEN  --  2.6* 1.6 2.6* 2.4*  --   --   --   TSH  --   --   --   --   --   --  1.965  --   BNP  --   --   --   --   --   --  357.9* 491.7*  MG  --   --   --   --   --  2.0 1.9 1.9  CALCIUM 8.9 7.7*  --   --   --  8.4* 8.3* 8.1*      Recent Labs  Lab 10/19/22 2030 10/20/22 0926 10/20/22 1029 10/20/22 1208 10/20/22 1541 10/22/22 1152 10/23/22 0733 10/24/22 0234  CRP  --  15.0*  --    --   --   --  4.6* 2.9*  PROCALCITON  --  38.08  --   --   --   --  3.40 1.35  LATICACIDVEN  --  2.6* 1.6 2.6* 2.4*  --   --   --   TSH  --   --   --   --   --   --  1.965  --   BNP  --   --   --   --   --   --  357.9* 491.7*  MG  --   --   --   --   --  2.0 1.9 1.9  CALCIUM 8.9 7.7*  --   --   --  8.4* 8.3* 8.1*   Radiology Reports CT MAXILLOFACIAL W CONTRAST  Result Date: 10/23/2022 CLINICAL DATA:  History of recurrent drainage from the right aspect of the palate EXAM: CT MAXILLOFACIAL WITH CONTRAST TECHNIQUE: Multidetector CT imaging of the maxillofacial structures was performed with intravenous contrast. Multiplanar CT image reconstructions were also generated. RADIATION DOSE REDUCTION: This exam was performed according to the departmental dose-optimization program which includes automated exposure control, adjustment of the mA and/or kV according to patient size and/or use of iterative reconstruction technique. CONTRAST:  75mL OMNIPAQUE IOHEXOL 350 MG/ML SOLN COMPARISON:  None Available. FINDINGS: Osseous: Osseous defect in the right anteromedial maxilla, adjacent to but not necessarily involving the roots of the right canine. There appears to be a soft tissue lesion in the defect, which measures 13 x 15 x 12 mm (AP x TR x CC) (series 3, image 39 and series 7, image 32). Poor dentition with multifocal dental caries. No acute fracture. No mandibular dislocation. Orbits: No acute finding. Sinuses: Minimal mucosal thickening in the maxillary sinuses. Soft tissues: Prominent, rounded left level 1B lymph node measures up to 14 x 13 x 11 mm. No prominent right level 1B, bilateral level 2, or bilateral level 1A lymph nodes. No abnormal density lymph nodes. Limited intracranial: No significant or unexpected finding. IMPRESSION: 1. Osseous defect with suspected soft tissue lesion in the right anteromedial maxilla, which measures up to 15 mm. This could represent a neoplastic process or atypical infectious  process. Correlate with direct visualization and consider sampling. 2. Prominent, rounded left level 1B lymph node measuring up to 14 x 13 x 11 mm, which is nonspecific. 3. Poor dentition with multifocal dental caries. Electronically Signed   By: Wiliam Ke M.D.   On: 10/23/2022 13:50   DG Chest Port 1 View  Result Date: 10/23/2022 CLINICAL DATA:  Shortness of breath. EXAM: PORTABLE CHEST 1  VIEW COMPARISON:  One view chest x-ray 10/19/2022 FINDINGS: The heart size is normal. New bilateral lower lobe airspace disease is present, right greater than left. Lung volumes are low. The visualized soft tissues and bony thorax are unremarkable. IMPRESSION: New bilateral lower lobe airspace disease, right greater than left is concerning for pneumonia. Electronically Signed   By: Marin Roberts M.D.   On: 10/23/2022 08:11   ECHOCARDIOGRAM COMPLETE  Result Date: 10/22/2022    ECHOCARDIOGRAM REPORT   Patient Name:   TORRIAN CANION Date of Exam: 10/22/2022 Medical Rec #:  [id number]     Height:       74.0 in Accession #:    [id number]    Weight:       160.7 lb Date of Birth:  1991/05/23     BSA:          1.981 m Patient Age:    31 years      BP:           109/74 mmHg Patient Gender: M             HR:           46 bpm. Exam Location:  Inpatient Procedure: 2D Echo, Color Doppler and Cardiac Doppler Indications:    Endocarditis  History:        Patient has no prior history of Echocardiogram examinations.                 Risk Factors:History of polysubstance abuse.  Sonographer:    Irving Burton Senior RDCS Referring Phys: [id number] ROBERT S BYRUM IMPRESSIONS  1. Left ventricular ejection fraction, by estimation, is 50 to 55%. The left ventricle has low normal function. The left ventricle has no regional wall motion abnormalities. Left ventricular diastolic parameters were normal.  2. Right ventricular systolic function is normal. The right ventricular size is normal. Tricuspid regurgitation signal is inadequate for assessing PA  pressure.  3. The mitral valve is grossly normal. No evidence of mitral valve regurgitation.  4. The aortic valve is tricuspid. Aortic valve regurgitation is not visualized.  5. The inferior vena cava is dilated in size with <50% respiratory variability, suggesting right atrial pressure of 15 mmHg. FINDINGS  Left Ventricle: Left ventricular ejection fraction, by estimation, is 50 to 55%. The left ventricle has low normal function. The left ventricle has no regional wall motion abnormalities. The left ventricular internal cavity size was normal in size. There is no left ventricular hypertrophy. Left ventricular diastolic parameters were normal. Right Ventricle: The right ventricular size is normal. No increase in right ventricular wall thickness. Right ventricular systolic function is normal. Tricuspid regurgitation signal is inadequate for assessing PA pressure. Left Atrium: Left atrial size was normal in size. Right Atrium: Right atrial size was normal in size. Pericardium: Trivial pericardial effusion is present. Mitral Valve: The mitral valve is grossly normal. No evidence of mitral valve regurgitation. Tricuspid Valve: The tricuspid valve is grossly normal. Tricuspid valve regurgitation is trivial. Aortic Valve: The aortic valve is tricuspid. Aortic valve regurgitation is not visualized. Pulmonic Valve: The pulmonic valve was grossly normal. Pulmonic valve regurgitation is not visualized. Aorta: The aortic root and ascending aorta are structurally normal, with no evidence of dilitation. Venous: The inferior vena cava is dilated in size with less than 50% respiratory variability, suggesting right atrial pressure of 15 mmHg. IAS/Shunts: No atrial level shunt detected by color flow Doppler.  LEFT VENTRICLE PLAX 2D LVIDd:  5.10 cm      Diastology LVIDs:         3.70 cm      LV e' medial:    13.30 cm/s LV PW:         0.80 cm      LV E/e' medial:  8.0 LV IVS:        0.80 cm      LV e' lateral:   14.50 cm/s LVOT  diam:     2.00 cm      LV E/e' lateral: 7.4 LV SV:         79 LV SV Index:   40 LVOT Area:     3.14 cm  LV Volumes (MOD) LV vol d, MOD A2C: 113.0 ml LV vol d, MOD A4C: 111.0 ml LV vol s, MOD A2C: 47.2 ml LV vol s, MOD A4C: 50.8 ml LV SV MOD A2C:     65.8 ml LV SV MOD A4C:     111.0 ml LV SV MOD BP:      63.8 ml RIGHT VENTRICLE RV S prime:     12.50 cm/s TAPSE (M-mode): 2.0 cm LEFT ATRIUM             Index        RIGHT ATRIUM           Index LA diam:        3.60 cm 1.82 cm/m   RA Area:     12.90 cm LA Vol (A2C):   40.7 ml 20.55 ml/m  RA Volume:   26.90 ml  13.58 ml/m LA Vol (A4C):   53.2 ml 26.86 ml/m LA Biplane Vol: 49.5 ml 24.99 ml/m  AORTIC VALVE LVOT Vmax:   114.00 cm/s LVOT Vmean:  89.500 cm/s LVOT VTI:    0.252 m  AORTA Ao Root diam: 3.20 cm MITRAL VALVE MV Area (PHT): 3.36 cm     SHUNTS MV Decel Time: 226 msec     Systemic VTI:  0.25 m MV E velocity: 107.00 cm/s  Systemic Diam: 2.00 cm MV A velocity: 38.30 cm/s MV E/A ratio:  2.79 Carolan Clines Electronically signed by Carolan Clines Signature Date/Time: 10/22/2022/4:07:52 PM    Final       Signature  -   Susa Raring M.D on 10/24/2022 at 8:39 AM   -  To page go to [email or web address]

## 2022-10-25 ENCOUNTER — Other Ambulatory Visit (HOSPITAL_COMMUNITY): Payer: Self-pay

## 2022-10-25 DIAGNOSIS — N179 Acute kidney failure, unspecified: Secondary | ICD-10-CM | POA: Diagnosis not present

## 2022-10-25 LAB — CULTURE, BLOOD (ROUTINE X 2)
Culture: NO GROWTH
Special Requests: ADEQUATE

## 2022-10-25 NOTE — Progress Notes (Signed)
CSW received referral regarding substance use resources. Resources placed on AVS for follow up at discharge including info for safe needle exchange.  Joaquin Courts, MSW, [facility]

## 2022-10-25 NOTE — Progress Notes (Signed)
Pharmacy Antibiotic Note  Chad James is a 32 y.o. male admitted on 10/19/2022 with right maxillary abscess. Pharmacy has been consulted for Vancomycin dosing.  Renal function stable since 4/26 levels.   Plan: Continue Vancomycin 1000mg  IV Q12H   Continue Zosyn 3.375g Q8H Planned discharge 4/30, f/u PO antibiotics   Height: 6\' 2"  (188 cm) Weight: 94.3 kg (207 lb 14.3 oz) IBW/kg (Calculated) : 82.2  Temp (24hrs), Avg:97.7 F (36.5 C), Min:97.3 F (36.3 C), Max:98.2 F (36.8 C)  Recent Labs  Lab 10/19/22 2030 10/20/22 0926 10/20/22 1029 10/20/22 1208 10/20/22 1541 10/22/22 1152 10/22/22 2135 10/23/22 0733 10/24/22 0234  WBC 3.4* 10.6*  --   --   --  5.9  --  4.8 5.4  CREATININE 1.53* 1.31*  --   --   --  0.90  --  1.04 1.04  LATICACIDVEN  --  2.6* 1.6 2.6* 2.4*  --   --   --   --   VANCOTROUGH  --   --   --   --   --   --  11*  --   --   VANCOPEAK  --   --   --   --   --  31  --   --   --      Estimated Creatinine Clearance: 119.7 mL/min (by C-G formula based on SCr of 1.04 mg/dL).    No Known Allergies  Antimicrobials: Vanc 4/24> Zosyn 4/24>  Vancomycin levels 4/26 VP 31, VT 11 = AUC 522.   Microbiology  4/24 Bcx: [id number] 4/24 MRSA: neg 4/24 GI PCR: neg 4/24 Cdiff: neg 4/24 HIV: neg   Thank you for allowing pharmacy to be a part of this patient's care.  Alphia Moh, PharmD, BCPS, BCCP Clinical Pharmacist  Please check AMION for all [facility] Pharmacy phone numbers After 10:00 PM, call Main Pharmacy [phone]

## 2022-10-25 NOTE — Progress Notes (Signed)
PROGRESS NOTE                                                                                                                                                                                                             Patient Demographics:    Chad James, is a 32 y.o. 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   Subjective:   Patient in bed, appears comfortable, denies any headache, no fever, no chest pain or pressure, no shortness of breath , no abdominal pain. No focal weakness.   Assessment  & Plan :   Hypotension caused by combination of sepsis and hypovolemia due to recent nausea vomiting - he has recent history of IV drug use, presented with nausea vomiting and abdominal pain with severe hypotension and admitted to ICU.  Upon admission  procalcitonin was greater than 35, CRP was greater than 15, blood cultures however remain negative, has been on empiric IV antibiotics since admission, initially required multiple liters of IV fluid and  pressors for few days, sepsis has now resolved.  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 Mia Creek who will see the patient in the office in the next 1 to 2 days.  No other clear source of infection so far could be identified but he is high risk for occult endocarditis due to his recent multiple year positive history of IV heroin use.  Continue to monitor cultures.   IV drug use.  Used heroin for multiple years until few weeks ago, currently on Suboxone this was started outpatient.  Nausea vomiting and abdominal pain upon admission.  This has completely resolved will monitor.  Hypokalemia.  Replaced.    Dehydration AKI.  Resolved after IV fluids.  Single lymph node noted on CT scan 14 x 13 x 11 mm in size, nonspecific.  Outpatient follow-up with general surgery to see if this can be biopsied.  PCP to arrange.      Condition - Fair  Family Communication  :  None  Code Status :  Full  Consults  :  PCCM, dentist  PUD Prophylaxis :    Procedures  :     CT Maxillo facial - 1. Osseous defect with suspected soft tissue lesion in the right anteromedial maxilla, which measures up to 15 mm. This could represent a neoplastic process or atypical infectious process. Correlate with direct visualization and consider sampling. 2. Prominent, rounded left level 1B lymph node measuring up to 14 x 13 x 11 mm, which is nonspecific. 3. Poor dentition with multifocal dental caries.  CT Head - Non acute  TTE - 1. Left ventricular ejection fraction, by estimation, is 50 to 55%. The left ventricle has low normal function. The left ventricle has no regional wall motion abnormalities. Left ventricular diastolic parameters were normal.  2. Right ventricular systolic function is normal. The right ventricular size is normal.  Tricuspid regurgitation signal is inadequate for assessing PA pressure.  3. The mitral valve is grossly normal. No evidence of mitral valve regurgitation.  4. The aortic valve is tricuspid. Aortic valve regurgitation is not visualized.  5. The inferior vena cava is dilated in size with <50% respiratory variability, suggesting right atrial pressure of 15 mmHg      Disposition Plan  :    Status is: Inpatient   DVT Prophylaxis  :  Heparin  heparin injection 5,000 Units Start: 10/23/22 1400 SCDs Start: 10/20/22 0844 SCDs Start: 10/20/22 0612   Lab Results  Component Value Date   PLT 169 10/24/2022    Diet :  Diet Order             Diet regular Room service appropriate? Yes; Fluid consistency: Thin  Diet effective now                    Inpatient Medications  Scheduled Meds:  buprenorphine-naloxone  2 tablet Sublingual BID   Chlorhexidine Gluconate Cloth  6 each Topical Daily   fiber supplement (BANATROL TF)  60 mL Oral BID   gabapentin  300 mg Oral QHS   heparin injection (subcutaneous)  5,000 Units Subcutaneous Q8H   nicotine  21 mg Transdermal Daily   Continuous  Infusions:  piperacillin-tazobactam (ZOSYN)  IV 3.375 g (10/25/22 0025)   vancomycin 1,000 mg (10/24/22 2201)   PRN Meds:.acetaminophen, ALPRAZolam, docusate sodium, melatonin, ondansetron (ZOFRAN) IV, mouth rinse    Objective:   Vitals:   10/24/22 1500 10/24/22 1900 10/25/22 0355 10/25/22 0825  BP: (!) 145/94 (!) 120/92  125/87  Pulse: (!) 50 66    Resp: 14 17  18   Temp: (!) 97.5 F (36.4 C) 98.2 F (36.8 C)  (!) 97.3 F (36.3 C)  TempSrc: Oral Oral  Oral  SpO2: 95% 97%    Weight:   94.3 kg   Height:        Wt Readings from Last 3 Encounters:  10/25/22 94.3 kg  04/14/20 74.8 kg  08/08/19 74.8 kg    No intake or output data in the 24 hours ending 10/25/22 0936    Physical Exam  Awake Alert, No new F.N deficits, Normal affect [place].AT,PERRAL, R upper palate a soft 1cm fluctuant  mass Supple Neck, No JVD,   Symmetrical Chest wall movement, Good air movement bilaterally, CTAB RRR,No Gallops,Rubs or new Murmurs,  +ve B.Sounds, Abd Soft, No tenderness,   Multiple track marks all over his extremities upper and lower     Data Review:    Recent Labs  Lab 10/19/22 2030 10/20/22 0926 10/22/22 1152 10/23/22 0733 10/24/22 0234  WBC 3.4* 10.6* 5.9 4.8 5.4  HGB 12.3* 9.6* 11.6* 11.8* 10.5*  HCT 38.1* 30.3* 35.5* 36.3* 31.2*  PLT 136* 104* 138* 161 169  MCV 84.9 88.3 85.3 85.4 83.4  MCH 27.4 28.0 27.9 27.8 28.1  MCHC 32.3 31.7 32.7 32.5 33.7  RDW 13.5 14.3 14.0 13.9 13.7  LYMPHSABS 0.2* 0.4*  --   --  1.7  MONOABS 0.0* 0.1  --   --  0.3  EOSABS 0.0 0.0  --   --  0.1  BASOSABS 0.0 0.0  --   --  0.0    Recent Labs  Lab 10/19/22 2030 10/20/22 0926 10/20/22 1029 10/20/22 1208 10/20/22 1541 10/22/22 1152 10/23/22 0733 10/24/22 0234  NA 135 138  --   --   --  142 143 140  K 3.3* 4.3  --   --   --  3.8 3.5 3.1*  CL 99 107  --   --   --  111 111 109  CO2 22 23  --   --   --  22 23 26   ANIONGAP 14 8  --   --   --  9 9 5   GLUCOSE 100* 121*  --   --   --  107* 125* 97  BUN 15 17  --   --   --  6 6 5*  CREATININE 1.53* 1.31*  --   --   --  0.90 1.04 1.04  AST 54* 41  --   --   --   --  29 35  ALT 54* 37  --   --   --   --  32 37  ALKPHOS 119 74  --   --   --   --  60 58  BILITOT 2.2* 0.7  --   --   --   --  0.7 0.6  ALBUMIN 3.7 2.5*  --   --   --   --  3.0* 2.6*  CRP  --  15.0*  --   --   --   --  4.6* 2.9*  PROCALCITON  --  38.08  --   --   --   --  3.40 1.35  LATICACIDVEN  --  2.6* 1.6 2.6* 2.4*  --   --   --   TSH  --   --   --   --   --   --  1.965  --   BNP  --   --   --   --   --   --  357.9* 491.7*  MG  --   --   --   --   --  2.0 1.9 1.9  CALCIUM 8.9 7.7*  --   --   --  8.4* 8.3* 8.1*      Recent Labs  Lab 10/19/22 2030 10/20/22 0926 10/20/22 1029 10/20/22 1208 10/20/22 1541 10/22/22 1152 10/23/22 0733 10/24/22 0234  CRP  --  15.0*  --    --   --   --  4.6* 2.9*  PROCALCITON  --  38.08  --   --   --   --  3.40 1.35  LATICACIDVEN  --  2.6* 1.6 2.6* 2.4*  --   --   --   TSH  --   --   --   --   --   --  1.965  --   BNP  --   --   --   --   --   --  357.9* 491.7*  MG  --   --   --   --   --  2.0 1.9 1.9  CALCIUM 8.9 7.7*  --   --   --  8.4* 8.3* 8.1*   Radiology Reports CT MAXILLOFACIAL W CONTRAST  Result Date: 10/23/2022 CLINICAL DATA:  History of recurrent drainage from the right aspect of the palate EXAM: CT MAXILLOFACIAL WITH CONTRAST TECHNIQUE: Multidetector CT imaging of the maxillofacial structures was performed with intravenous contrast. Multiplanar CT image reconstructions were also generated. RADIATION DOSE REDUCTION: This exam was performed according to the departmental dose-optimization program which includes automated exposure control, adjustment of the mA and/or kV according to patient size and/or use of iterative reconstruction technique. CONTRAST:  75mL OMNIPAQUE IOHEXOL 350 MG/ML SOLN COMPARISON:  None Available. FINDINGS: Osseous: Osseous defect in the right anteromedial maxilla, adjacent to but not necessarily involving the roots of the right canine. There appears to be a soft tissue lesion in the defect, which measures 13 x 15 x 12 mm (AP x TR x CC) (series 3, image 39 and series 7, image 32). Poor dentition with multifocal dental caries. No acute fracture. No mandibular dislocation. Orbits: No acute finding. Sinuses: Minimal mucosal thickening in the maxillary sinuses. Soft tissues: Prominent, rounded left level 1B lymph node measures up to 14 x 13 x 11 mm. No prominent right level 1B, bilateral level 2, or bilateral level 1A lymph nodes. No abnormal density lymph nodes. Limited intracranial: No significant or unexpected finding. IMPRESSION: 1. Osseous defect with suspected soft tissue lesion in the right anteromedial maxilla, which measures up to 15 mm. This could represent a neoplastic process or atypical infectious  process. Correlate with direct visualization and consider sampling. 2. Prominent, rounded left level 1B lymph node measuring up to 14 x 13 x 11 mm, which is nonspecific. 3. Poor dentition with multifocal dental caries. Electronically Signed   By: Wiliam Ke M.D.   On: 10/23/2022 13:50   DG Chest Port 1 View  Result Date: 10/23/2022 CLINICAL DATA:  Shortness of breath. EXAM: PORTABLE CHEST 1 VIEW COMPARISON:  One view chest x-ray 10/19/2022 FINDINGS: The heart size is  normal. New bilateral lower lobe airspace disease is present, right greater than left. Lung volumes are low. The visualized soft tissues and bony thorax are unremarkable. IMPRESSION: New bilateral lower lobe airspace disease, right greater than left is concerning for pneumonia. Electronically Signed   By: Marin Roberts M.D.   On: 10/23/2022 08:11   ECHOCARDIOGRAM COMPLETE  Result Date: 10/22/2022    ECHOCARDIOGRAM REPORT   Patient Name:   Chad James Date of Exam: 10/22/2022 Medical Rec #:  [id number]     Height:       74.0 in Accession #:    [id number]    Weight:       160.7 lb Date of Birth:  1991/06/24     BSA:          1.981 m Patient Age:    31 years      BP:           109/74 mmHg Patient Gender: M             HR:           46 bpm. Exam Location:  Inpatient Procedure: 2D Echo, Color Doppler and Cardiac Doppler Indications:    Endocarditis  History:        Patient has no prior history of Echocardiogram examinations.                 Risk Factors:History of polysubstance abuse.  Sonographer:    Irving Burton Senior RDCS Referring Phys: [id number] ROBERT S BYRUM IMPRESSIONS  1. Left ventricular ejection fraction, by estimation, is 50 to 55%. The left ventricle has low normal function. The left ventricle has no regional wall motion abnormalities. Left ventricular diastolic parameters were normal.  2. Right ventricular systolic function is normal. The right ventricular size is normal. Tricuspid regurgitation signal is inadequate for assessing PA  pressure.  3. The mitral valve is grossly normal. No evidence of mitral valve regurgitation.  4. The aortic valve is tricuspid. Aortic valve regurgitation is not visualized.  5. The inferior vena cava is dilated in size with <50% respiratory variability, suggesting right atrial pressure of 15 mmHg. FINDINGS  Left Ventricle: Left ventricular ejection fraction, by estimation, is 50 to 55%. The left ventricle has low normal function. The left ventricle has no regional wall motion abnormalities. The left ventricular internal cavity size was normal in size. There is no left ventricular hypertrophy. Left ventricular diastolic parameters were normal. Right Ventricle: The right ventricular size is normal. No increase in right ventricular wall thickness. Right ventricular systolic function is normal. Tricuspid regurgitation signal is inadequate for assessing PA pressure. Left Atrium: Left atrial size was normal in size. Right Atrium: Right atrial size was normal in size. Pericardium: Trivial pericardial effusion is present. Mitral Valve: The mitral valve is grossly normal. No evidence of mitral valve regurgitation. Tricuspid Valve: The tricuspid valve is grossly normal. Tricuspid valve regurgitation is trivial. Aortic Valve: The aortic valve is tricuspid. Aortic valve regurgitation is not visualized. Pulmonic Valve: The pulmonic valve was grossly normal. Pulmonic valve regurgitation is not visualized. Aorta: The aortic root and ascending aorta are structurally normal, with no evidence of dilitation. Venous: The inferior vena cava is dilated in size with less than 50% respiratory variability, suggesting right atrial pressure of 15 mmHg. IAS/Shunts: No atrial level shunt detected by color flow Doppler.  LEFT VENTRICLE PLAX 2D LVIDd:         5.10 cm      Diastology LVIDs:  3.70 cm      LV e' medial:    13.30 cm/s LV PW:         0.80 cm      LV E/e' medial:  8.0 LV IVS:        0.80 cm      LV e' lateral:   14.50 cm/s LVOT  diam:     2.00 cm      LV E/e' lateral: 7.4 LV SV:         79 LV SV Index:   40 LVOT Area:     3.14 cm  LV Volumes (MOD) LV vol d, MOD A2C: 113.0 ml LV vol d, MOD A4C: 111.0 ml LV vol s, MOD A2C: 47.2 ml LV vol s, MOD A4C: 50.8 ml LV SV MOD A2C:     65.8 ml LV SV MOD A4C:     111.0 ml LV SV MOD BP:      63.8 ml RIGHT VENTRICLE RV S prime:     12.50 cm/s TAPSE (M-mode): 2.0 cm LEFT ATRIUM             Index        RIGHT ATRIUM           Index LA diam:        3.60 cm 1.82 cm/m   RA Area:     12.90 cm LA Vol (A2C):   40.7 ml 20.55 ml/m  RA Volume:   26.90 ml  13.58 ml/m LA Vol (A4C):   53.2 ml 26.86 ml/m LA Biplane Vol: 49.5 ml 24.99 ml/m  AORTIC VALVE LVOT Vmax:   114.00 cm/s LVOT Vmean:  89.500 cm/s LVOT VTI:    0.252 m  AORTA Ao Root diam: 3.20 cm MITRAL VALVE MV Area (PHT): 3.36 cm     SHUNTS MV Decel Time: 226 msec     Systemic VTI:  0.25 m MV E velocity: 107.00 cm/s  Systemic Diam: 2.00 cm MV A velocity: 38.30 cm/s MV E/A ratio:  2.79 Carolan Clines Electronically signed by Carolan Clines Signature Date/Time: 10/22/2022/4:07:52 PM    Final       Signature  -   Susa Raring M.D on 10/25/2022 at 9:36 AM   -  To page go to [email or web address]

## 2022-10-25 NOTE — Progress Notes (Signed)
Patient refused morning labs.

## 2022-10-25 NOTE — Care Management (Signed)
  Transition of Care [facility]) Screening Note   Patient Details  Name: Chad James Date of Birth: [birth year]   Transition of Care [facility]) CM/SW Contact:    Gordy Clement, RN Phone Number: 10/25/2022, 1:18 PM  Update:  Dental resources were given and patient stated he would follow up     Transition of Care Department [facility]) has reviewed patient .  Patient is from home. Completing IVABX and will go home on oral. PCP is established   We will continue to monitor patient advancement through interdisciplinary progression rounds. If new patient transition needs arise, please place a TOC consult.

## 2022-10-26 ENCOUNTER — Other Ambulatory Visit (HOSPITAL_COMMUNITY): Payer: Self-pay

## 2022-10-26 DIAGNOSIS — N179 Acute kidney failure, unspecified: Secondary | ICD-10-CM | POA: Diagnosis not present

## 2022-10-26 LAB — CBC WITH DIFFERENTIAL/PLATELET
Abs Immature Granulocytes: 0.07 10*3/uL (ref 0.00–0.07)
Basophils Absolute: 0 10*3/uL (ref 0.0–0.1)
Basophils Relative: 0 %
Eosinophils Absolute: 0.1 10*3/uL (ref 0.0–0.5)
Eosinophils Relative: 1 %
HCT: 32.4 % — ABNORMAL LOW (ref 39.0–52.0)
Hemoglobin: 10.8 g/dL — ABNORMAL LOW (ref 13.0–17.0)
Immature Granulocytes: 1 %
Lymphocytes Relative: 29 %
Lymphs Abs: 2 10*3/uL (ref 0.7–4.0)
MCH: 28.1 pg (ref 26.0–34.0)
MCHC: 33.3 g/dL (ref 30.0–36.0)
MCV: 84.4 fL (ref 80.0–100.0)
Monocytes Absolute: 0.4 10*3/uL (ref 0.1–1.0)
Monocytes Relative: 6 %
Neutro Abs: 4.3 10*3/uL (ref 1.7–7.7)
Neutrophils Relative %: 63 %
Platelets: 214 10*3/uL (ref 150–400)
RBC: 3.84 MIL/uL — ABNORMAL LOW (ref 4.22–5.81)
RDW: 14.1 % (ref 11.5–15.5)
WBC: 6.9 10*3/uL (ref 4.0–10.5)
nRBC: 0 % (ref 0.0–0.2)

## 2022-10-26 LAB — COMPREHENSIVE METABOLIC PANEL WITH GFR
ALT: 35 U/L (ref 0–44)
AST: 30 U/L (ref 15–41)
Albumin: 2.8 g/dL — ABNORMAL LOW (ref 3.5–5.0)
Alkaline Phosphatase: 58 U/L (ref 38–126)
Anion gap: 10 (ref 5–15)
BUN: 7 mg/dL (ref 6–20)
CO2: 25 mmol/L (ref 22–32)
Calcium: 8.3 mg/dL — ABNORMAL LOW (ref 8.9–10.3)
Chloride: 106 mmol/L (ref 98–111)
Creatinine, Ser: 1.17 mg/dL (ref 0.61–1.24)
GFR, Estimated: >60 mL/min
Glucose, Bld: 100 mg/dL — ABNORMAL HIGH (ref 70–99)
Potassium: 3.5 mmol/L (ref 3.5–5.1)
Sodium: 141 mmol/L (ref 135–145)
Total Bilirubin: 0.5 mg/dL (ref 0.3–1.2)
Total Protein: 5.6 g/dL — ABNORMAL LOW (ref 6.5–8.1)

## 2022-10-26 LAB — C-REACTIVE PROTEIN: CRP: 1.8 mg/dL — ABNORMAL HIGH (ref <1.0)

## 2022-10-26 LAB — MAGNESIUM: Magnesium: 2 mg/dL (ref 1.7–2.4)

## 2022-10-26 LAB — PROCALCITONIN: Procalcitonin: 0.19 ng/mL

## 2022-10-26 MED ORDER — AMOXICILLIN-POT CLAVULANATE 875-125 MG PO TABS
1.0000 | ORAL_TABLET | Freq: Two times a day (BID) | ORAL | 0 refills | Status: AC
  Filled 2022-10-26: qty 24, 12d supply, fill #0

## 2022-10-26 MED ORDER — AMOXICILLIN-POT CLAVULANATE 875-125 MG PO TABS: 1.0000 | ORAL_TABLET | Freq: Two times a day (BID) | ORAL | Status: DC

## 2022-10-26 MED ORDER — POTASSIUM CHLORIDE CRYS ER 20 MEQ PO TBCR
40.0000 meq | EXTENDED_RELEASE_TABLET | Freq: Once | ORAL | Status: AC
  Administered 2022-10-26: 40 meq via ORAL
  Filled 2022-10-26: qty 2

## 2022-10-26 MED ORDER — NICOTINE 21 MG/24HR TD PT24
21.0000 mg | MEDICATED_PATCH | Freq: Every day | TRANSDERMAL | 0 refills | Status: AC
  Filled 2022-10-26: qty 28, 28d supply, fill #0

## 2022-10-26 NOTE — Discharge Summary (Addendum)
Chad James:[id number] DOB: 07-19-1990 DOA: 10/19/2022  PCP: Herb Grays, MD (Inactive)  Admit date: 10/19/2022  Discharge date: 10/26/2022  Admitted From: Home   Disposition:  Home   Recommendations for Outpatient Follow-up:   Follow up with PCP in 1-2 weeks  PCP Please obtain BMP/CBC, 2 view CXR in 1week,  (see Discharge instructions)   PCP Please follow up on the following pending results: Needs close outpatient follow-up with dental surgery within 7 to 10 days of discharge.  Has right maxillary abscess for the last 2 to 3 months likely source of his infection.  Please review CT scan findings.  Needs another CT scan of his maxillofacial and soft tissue neck with IV contrast in 7 to 10 days.   Home Health: None Equipment/Devices - none Consultations: Dentist Dr. 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                                                                  Hospital Course   Hypotension caused by combination of sepsis and hypovolemia due to recent  nausea vomiting - he has recent history of IV drug use, presented with nausea vomiting and abdominal pain with severe hypotension and admitted to ICU.  Upon admission procalcitonin was greater than 35, CRP was greater than 15, blood cultures however remain negative, has been on empiric IV antibiotics since admission, initially required multiple liters of IV fluid and pressors for few days, sepsis has now resolved.  Transthoracic echocardiogram stable, blood cultures thus far negative x 5 days, he does have history of recurrent drainage from his right upper palate, CT scan confirms possible right maxillary abscess, case discussed with dentist on-call Dr. Mia Creek who will see the patient in the office in the next few days, his office will call the patient.  Case was discussed with him in detail he reviewed all the imaging findings and will see him following Monday, patient agreeable with the plan, eager to  go home.  No other clear source of infection so far could be identified but he is high risk for occult endocarditis due to his recent multiple year positive history of IV heroin use.  Will be placed on 2 weeks of Augmentin with outpatient follow-up with PCP and dentist.  Outpatient resources for there dental offices also provided to the patient via TOC, he is comfortable with the discharge process has got all the phone numbers, confirms that he will call and make appointments as needed. Have also discussed this with Dr. Mart Piggs office phone #[phone] office manager Debbie who will call the patient herself and set up an appointment within the next few days.   Patient's mother subsequently called post DC - shouting on the phone and extremely irate, threatened to sue the staff if anything happened to the patient, she wanted [place] to make referral  to other oral surgeons, so that patient could be seen in the next 1 to 2 days, she was told that we cannot refer a patient likely outpatient offices but we can talk  to an office or send discharge paperwork if that is required from our inpatient service. However she was adamant that we need to make a formal referral, not letting me talk or ask what is that she wants [place] to do ?  Called Oral MD Dr Dalbert Mayotte office, which she told [facility] team about, office in [facility] [phone], talked to the office manager who just wanted a copy of discharge summary, this will be faxed to [phone] [facility].  This is all the paperwork they wanted. They did not need any further formal referral paperwork.     IV drug use.  Used heroin for multiple years until few weeks ago, currently on Suboxone this was started outpatient.   Nausea vomiting and abdominal pain upon admission.  This has completely resolved will monitor.   Hypokalemia.  Replaced.     Dehydration AKI.  Resolved after IV fluids.   Single lymph node noted on CT scan 14 x 13 x 11 mm in size, nonspecific.  Outpatient follow-up with general surgery to see if this can be biopsied.  PCP to arrange.    Discharge diagnosis     Principal Problem:   AKI (acute kidney injury) (HCC) Active Problems:   [facility])    Discharge instructions    Discharge Instructions     Diet - low sodium heart healthy   Complete by: As directed    Discharge instructions   Complete by: As directed    Follow with Primary MD Herb Grays, MD (Inactive) in 7 days   Get CBC, CMP, 2 view Chest X ray -  checked next visit with your primary MD and you need a CT scan with IV contrast maxillofacial and neck by a family physician in the next 3 to 4 weeks.  Activity: As tolerated with Full fall precautions use walker/cane & assistance as needed  Disposition Home    Diet: Heart Healthy    Special Instructions: If you have smoked or chewed Tobacco  in the last 2 yrs please stop smoking, stop any regular Alcohol  and or any Recreational drug use.  On your next visit with your primary care physician please Get Medicines  reviewed and adjusted.  Please request your Prim.MD to go over all Hospital Tests and Procedure/Radiological results at the follow up, please get all Hospital records sent to your Prim MD by signing hospital release before you go home.  If you experience worsening of your admission symptoms, develop shortness of breath, life threatening emergency, suicidal or homicidal thoughts you must seek medical attention immediately by calling 911 or calling your MD immediately  if symptoms less severe.  You Must read complete instructions/literature along with all the possible adverse reactions/side effects for all the Medicines you take and that have been prescribed to you. Take any new Medicines after you have completely understood and accpet all the possible adverse reactions/side effects.   Increase activity slowly   Complete by: As directed        Discharge Medications   Allergies as of 10/26/2022   No Known Allergies      Medication List     STOP taking these medications    naloxone 4 MG/0.1ML Liqd nasal spray kit Commonly known as: NARCAN       TAKE these medications    amoxicillin-clavulanate 875-125 MG tablet Commonly known as: AUGMENTIN Take 1 tablet by mouth every 12 (twelve) hours. Start taking on: Oct 27, 2022   gabapentin 300 MG capsule Commonly known as: NEURONTIN Take 300 mg by mouth at bedtime.   nicotine 21 mg/24hr patch Commonly known as: NICODERM CQ - dosed in mg/24 hours Place 1 patch (21 mg total) onto the skin daily.   Suboxone 4-1 MG Film Generic drug: Buprenorphine HCl-Naloxone HCl Place 1 mg of opioid under the tongue 2 (two) times daily.         Follow-up Information     Herb Grays, MD. Call .   Specialty: Family Medicine Why: Follow up from ER visit Contact information: [address] [facility] [place] [address] [phone]         [facility]. Go to .   Specialty: Urgent  Care Why: As needed Contact information: [address] [place] [address] [phone]        [facility] Emergency Department at [facility]. Go to .   Specialty: Emergency Medicine Why: As needed, If symptoms worsen Contact information: [address] [id number] [place] [address] [phone]        Loura Pardon, DMD. Schedule an appointment as soon as possible for a visit in 1 week(s).   Specialty: Dentistry Contact information: [facility] [address] [place] [address] [phone]                 Major procedures and Radiology Reports - PLEASE review detailed and final reports thoroughly  -      CT MAXILLOFACIAL W CONTRAST  Result Date: 10/23/2022 CLINICAL DATA:  History of recurrent drainage from the right aspect of the palate EXAM: CT MAXILLOFACIAL WITH CONTRAST TECHNIQUE: Multidetector CT imaging of the maxillofacial structures was performed with intravenous contrast. Multiplanar CT image reconstructions were also generated. RADIATION DOSE REDUCTION: This exam was performed according to the departmental dose-optimization program which includes automated exposure control, adjustment of the mA and/or kV according to patient size and/or use of iterative reconstruction technique. CONTRAST:  75mL OMNIPAQUE IOHEXOL 350 MG/ML SOLN COMPARISON:  None Available. FINDINGS: Osseous: Osseous defect in the right anteromedial maxilla, adjacent to but not necessarily involving the roots of the right canine. There appears to be a soft tissue lesion in the defect, which measures 13 x 15 x 12 mm (AP x TR x CC) (series 3, image 39 and series 7, image 32). Poor dentition with multifocal dental caries. No acute fracture. No mandibular dislocation. Orbits: No  acute finding. Sinuses: Minimal mucosal thickening in the maxillary sinuses. Soft tissues: Prominent, rounded left level 1B lymph node measures up to 14 x 13 x  11 mm. No prominent right level 1B, bilateral level 2, or bilateral level 1A lymph nodes. No abnormal density lymph nodes. Limited intracranial: No significant or unexpected finding. IMPRESSION: 1. Osseous defect with suspected soft tissue lesion in the right anteromedial maxilla, which measures up to 15 mm. This could represent a neoplastic process or atypical infectious process. Correlate with direct visualization and consider sampling. 2. Prominent, rounded left level 1B lymph node measuring up to 14 x 13 x 11 mm, which is nonspecific. 3. Poor dentition with multifocal dental caries. Electronically Signed   By: Wiliam Ke M.D.   On: 10/23/2022 13:50   DG Chest Port 1 View  Result Date: 10/23/2022 CLINICAL DATA:  Shortness of breath. EXAM: PORTABLE CHEST 1 VIEW COMPARISON:  One view chest x-ray 10/19/2022 FINDINGS: The heart size is normal. New bilateral lower lobe airspace disease is present, right greater than left. Lung volumes are low. The visualized soft tissues and bony thorax are unremarkable. IMPRESSION: New bilateral lower lobe airspace disease, right greater than left is concerning for pneumonia. Electronically Signed   By: Marin Roberts M.D.   On: 10/23/2022 08:11   ECHOCARDIOGRAM COMPLETE  Result Date: 10/22/2022    ECHOCARDIOGRAM REPORT   Patient Name:   TADAN SHILL Date of Exam: 10/22/2022 Medical Rec #:  [id number]     Height:       74.0 in Accession #:    [id number]    Weight:       160.7 lb Date of Birth:  04-May-1991     BSA:          1.981 m Patient Age:    31 years      BP:           109/74 mmHg Patient Gender: M             HR:           46 bpm. Exam Location:  Inpatient Procedure: 2D Echo, Color Doppler and Cardiac Doppler Indications:    Endocarditis  History:        Patient has no prior history of Echocardiogram examinations.                 Risk Factors:History of polysubstance abuse.  Sonographer:    Irving Burton Senior RDCS Referring Phys: [id number] ROBERT S BYRUM IMPRESSIONS  1.  Left ventricular ejection fraction, by estimation, is 50 to 55%. The left ventricle has low normal function. The left ventricle has no regional wall motion abnormalities. Left ventricular diastolic parameters were normal.  2. Right ventricular systolic function is normal. The right ventricular size is normal. Tricuspid regurgitation signal is inadequate for assessing PA pressure.  3. The mitral valve is grossly normal. No evidence of mitral valve regurgitation.  4. The aortic valve is tricuspid. Aortic valve regurgitation is not visualized.  5. The inferior vena cava is dilated in size with <50% respiratory variability, suggesting right atrial pressure of 15 mmHg. FINDINGS  Left Ventricle: Left ventricular ejection fraction, by estimation, is 50 to 55%. The left ventricle has low normal function. The left ventricle has no regional wall motion abnormalities. The left ventricular internal cavity size was normal in size. There is no left ventricular hypertrophy. Left ventricular diastolic parameters were normal. Right Ventricle: The right ventricular size is normal. No increase in right ventricular wall  thickness. Right ventricular systolic function is normal. Tricuspid regurgitation signal is inadequate for assessing PA pressure. Left Atrium: Left atrial size was normal in size. Right Atrium: Right atrial size was normal in size. Pericardium: Trivial pericardial effusion is present. Mitral Valve: The mitral valve is grossly normal. No evidence of mitral valve regurgitation. Tricuspid Valve: The tricuspid valve is grossly normal. Tricuspid valve regurgitation is trivial. Aortic Valve: The aortic valve is tricuspid. Aortic valve regurgitation is not visualized. Pulmonic Valve: The pulmonic valve was grossly normal. Pulmonic valve regurgitation is not visualized. Aorta: The aortic root and ascending aorta are structurally normal, with no evidence of dilitation. Venous: The inferior vena cava is dilated in size with less  than 50% respiratory variability, suggesting right atrial pressure of 15 mmHg. IAS/Shunts: No atrial level shunt detected by color flow Doppler.  LEFT VENTRICLE PLAX 2D LVIDd:         5.10 cm      Diastology LVIDs:         3.70 cm      LV e' medial:    13.30 cm/s LV PW:         0.80 cm      LV E/e' medial:  8.0 LV IVS:        0.80 cm      LV e' lateral:   14.50 cm/s LVOT diam:     2.00 cm      LV E/e' lateral: 7.4 LV SV:         79 LV SV Index:   40 LVOT Area:     3.14 cm  LV Volumes (MOD) LV vol d, MOD A2C: 113.0 ml LV vol d, MOD A4C: 111.0 ml LV vol s, MOD A2C: 47.2 ml LV vol s, MOD A4C: 50.8 ml LV SV MOD A2C:     65.8 ml LV SV MOD A4C:     111.0 ml LV SV MOD BP:      63.8 ml RIGHT VENTRICLE RV S prime:     12.50 cm/s TAPSE (M-mode): 2.0 cm LEFT ATRIUM             Index        RIGHT ATRIUM           Index LA diam:        3.60 cm 1.82 cm/m   RA Area:     12.90 cm LA Vol (A2C):   40.7 ml 20.55 ml/m  RA Volume:   26.90 ml  13.58 ml/m LA Vol (A4C):   53.2 ml 26.86 ml/m LA Biplane Vol: 49.5 ml 24.99 ml/m  AORTIC VALVE LVOT Vmax:   114.00 cm/s LVOT Vmean:  89.500 cm/s LVOT VTI:    0.252 m  AORTA Ao Root diam: 3.20 cm MITRAL VALVE MV Area (PHT): 3.36 cm     SHUNTS MV Decel Time: 226 msec     Systemic VTI:  0.25 m MV E velocity: 107.00 cm/s  Systemic Diam: 2.00 cm MV A velocity: 38.30 cm/s MV E/A ratio:  2.79 Photographer signed by Carolan Clines Signature Date/Time: 10/22/2022/4:07:52 PM    Final    CT Head Wo Contrast  Result Date: 10/19/2022 CLINICAL DATA:  Delirium EXAM: CT HEAD WITHOUT CONTRAST TECHNIQUE: Contiguous axial images were obtained from the base of the skull through the vertex without intravenous contrast. RADIATION DOSE REDUCTION: This exam was performed according to the departmental dose-optimization program which includes automated exposure control, adjustment of the mA and/or kV according to patient  size and/or use of iterative reconstruction technique. COMPARISON:  10/06/2019  FINDINGS: Brain: No acute intracranial abnormality. Specifically, no hemorrhage, hydrocephalus, mass lesion, acute infarction, or significant intracranial injury. Vascular: No hyperdense vessel or unexpected calcification. Skull: No acute calvarial abnormality. Sinuses/Orbits: No acute findings Other: None IMPRESSION: Normal study. Electronically Signed   By: Charlett Nose M.D.   On: 10/19/2022 21:21   DG Chest Portable 1 View  Result Date: 10/19/2022 CLINICAL DATA:  OD, altered mental status. EXAM: PORTABLE CHEST 1 VIEW COMPARISON:  04/14/2020. FINDINGS: The heart size and mediastinal contours are within normal limits. The pulmonary vasculature is distended. No consolidation, effusion, or pneumothorax. No acute osseous abnormality. IMPRESSION: Distended pulmonary vasculature. Electronically Signed   By: Thornell Sartorius M.D.   On: 10/19/2022 20:45    Micro Results    Recent Results (from the past 240 hour(s))  Culture, blood (Routine X 2) w Reflex to ID Panel     Status: None   Collection Time: 10/20/22  6:32 AM   Specimen: BLOOD RIGHT ARM  Result Value Ref Range Status   Specimen Description BLOOD RIGHT ARM  Final   Special Requests   Final    BOTTLES DRAWN AEROBIC AND ANAEROBIC Blood Culture adequate volume   Culture   Final    NO GROWTH 5 DAYS Performed at [facility], [address], [place], [place] [address]    Report Status 10/25/2022 FINAL  Final  C Difficile Quick Screen w PCR reflex     Status: None   Collection Time: 10/20/22  6:35 AM   Specimen: STOOL  Result Value Ref Range Status   C Diff antigen NEGATIVE NEGATIVE Final   C Diff toxin NEGATIVE NEGATIVE Final   C Diff interpretation No C. difficile detected.  Final    Comment: Performed at [facility], [address], [place], [place] [address]  Gastrointestinal Panel by PCR , Stool     Status: None   Collection Time: 10/20/22  9:06 AM   Specimen: STOOL  Result Value Ref Range Status   Campylobacter species  NOT DETECTED NOT DETECTED Final   Plesimonas shigelloides NOT DETECTED NOT DETECTED Final   Salmonella species NOT DETECTED NOT DETECTED Final   Yersinia enterocolitica NOT DETECTED NOT DETECTED Final   Vibrio species NOT DETECTED NOT DETECTED Final   Vibrio cholerae NOT DETECTED NOT DETECTED Final   Enteroaggregative E coli (EAEC) NOT DETECTED NOT DETECTED Final   Enteropathogenic E coli (EPEC) NOT DETECTED NOT DETECTED Final   Enterotoxigenic E coli (ETEC) NOT DETECTED NOT DETECTED Final   Shiga like toxin producing E coli (STEC) NOT DETECTED NOT DETECTED Final   Shigella/Enteroinvasive E coli (EIEC) NOT DETECTED NOT DETECTED Final   Cryptosporidium NOT DETECTED NOT DETECTED Final   Cyclospora cayetanensis NOT DETECTED NOT DETECTED Final   Entamoeba histolytica NOT DETECTED NOT DETECTED Final   Giardia lamblia NOT DETECTED NOT DETECTED Final   Adenovirus F40/41 NOT DETECTED NOT DETECTED Final   Astrovirus NOT DETECTED NOT DETECTED Final   Norovirus GI/GII NOT DETECTED NOT DETECTED Final   Rotavirus A NOT DETECTED NOT DETECTED Final   Sapovirus (I, II, IV, and V) NOT DETECTED NOT DETECTED Final    Comment: Performed at [facility], [address] [place], [place] [address]  Stool culture     Status: None   Collection Time: 10/20/22  9:07 AM   Specimen: STOOL  Result Value Ref Range Status   Salmonella/Shigella Screen Final report  Final   Campylobacter Culture Final report  Final   E coli, Shiga toxin Assay Negative Negative Final    Comment: (NOTE) Performed At: [facility] [address] [place], [place] [id number] Jolene Schimke MD WU:[id number]   STOOL CULTURE REFLEX - RSASHR     Status: None   Collection Time: 10/20/22  9:07 AM  Result Value Ref Range Status   Stool Culture result 1 (RSASHR) Comment  Final    Comment: (NOTE) No Salmonella or Shigella recovered. Performed At: [facility] [address] [place], [place] [id number] Jolene Schimke MD VH:[id number]   STOOL CULTURE Reflex - CMPCXR     Status: None   Collection Time: 10/20/22  9:07 AM  Result Value Ref Range Status   Stool Culture result 1 (CMPCXR) Comment  Final    Comment: (NOTE) No Campylobacter species isolated. Performed At: [facility] [address] [place], [place] [id number] Jolene Schimke MD UV:[id number]   MRSA Next Gen by PCR, Nasal     Status: None   Collection Time: 10/20/22 11:01 AM   Specimen: Nasal Mucosa; Nasal Swab  Result Value Ref Range Status   MRSA by PCR Next Gen NOT DETECTED NOT DETECTED Final    Comment: (NOTE) The GeneXpert MRSA Assay (FDA approved for NASAL specimens only), is one component of a comprehensive MRSA colonization surveillance program. It is not intended to diagnose MRSA infection nor to guide or monitor treatment for MRSA infections. Test performance is not FDA approved in patients less than 45 years old. Performed at [facility], [address] [place], [place] [address]   Culture, blood (Routine X 2) w Reflex to ID Panel     Status: None   Collection Time: 10/20/22 12:08 PM   Specimen: BLOOD RIGHT HAND  Result Value Ref Range Status   Specimen Description BLOOD RIGHT HAND  Final   Special Requests   Final    BOTTLES DRAWN AEROBIC AND ANAEROBIC Blood Culture adequate volume   Culture   Final    NO GROWTH 5 DAYS Performed at [facility], [address], [place], [place] [address]    Report Status 10/25/2022 FINAL  Final    Today   Subjective    Gearold Wainer today has no headache,no chest abdominal pain,no new weakness tingling or numbness, feels much better wants to go home today.    Objective   Blood pressure (!) 106/55, pulse 65, temperature 97.6 F (36.4 C), temperature source Oral, resp. rate 18, height 6\' 2"  (1.88 m), weight 94.3 kg, SpO2 98 %.  No intake or output data in the 24 hours ending 10/26/22 0821  Exam  Awake Alert, No new F.N deficits,    [place].AT,PERRAL,  R upper palate a soft 1cm fluctuant mass  Supple Neck,   Symmetrical Chest wall movement, Good air movement bilaterally, CTAB RRR,No Gallops,   +ve B.Sounds, Abd Soft, Non tender,  No Cyanosis, Clubbing or edema    Data Review   Recent Labs  Lab 10/19/22 2030 10/20/22 0926 10/22/22 1152 10/23/22 0733 10/24/22 0234 10/26/22 0553  WBC 3.4* 10.6* 5.9 4.8 5.4 6.9  HGB 12.3* 9.6* 11.6* 11.8* 10.5* 10.8*  HCT 38.1* 30.3* 35.5* 36.3* 31.2* 32.4*  PLT 136* 104* 138* 161 169 214  MCV 84.9 88.3 85.3 85.4 83.4 84.4  MCH 27.4 28.0 27.9 27.8 28.1 28.1  MCHC 32.3 31.7 32.7 32.5 33.7 33.3  RDW 13.5 14.3 14.0 13.9 13.7 14.1  LYMPHSABS 0.2* 0.4*  --   --  1.7 2.0  MONOABS 0.0* 0.1  --   --  0.3 0.4  EOSABS 0.0 0.0  --   --  0.1 0.1  BASOSABS 0.0 0.0  --   --  0.0 0.0    Recent Labs  Lab 10/19/22 2030 10/20/22 0926 10/20/22 1029 10/20/22 1208 10/20/22 1541 10/22/22 1152 10/23/22 0733 10/24/22 0234 10/26/22 0553  NA 135 138  --   --   --  142 143 140 141  K 3.3* 4.3  --   --   --  3.8 3.5 3.1* 3.5  CL 99 107  --   --   --  111 111 109 106  CO2 22 23  --   --   --  22 23 26 25   ANIONGAP 14 8  --   --   --  9 9 5 10   GLUCOSE 100* 121*  --   --   --  107* 125* 97 100*  BUN 15 17  --   --   --  6 6 5* 7  CREATININE 1.53* 1.31*  --   --   --  0.90 1.04 1.04 1.17  AST 54* 41  --   --   --   --  29 35 30  ALT 54* 37  --   --   --   --  32 37 35  ALKPHOS 119 74  --   --   --   --  60 58 58  BILITOT 2.2* 0.7  --   --   --   --  0.7 0.6 0.5  ALBUMIN 3.7 2.5*  --   --   --   --  3.0* 2.6* 2.8*  CRP  --  15.0*  --   --   --   --  4.6* 2.9* 1.8*  PROCALCITON  --  38.08  --   --   --   --  3.40 1.35  --   LATICACIDVEN  --  2.6* 1.6 2.6* 2.4*  --   --   --   --   TSH  --   --   --   --   --   --  1.965  --   --   BNP  --   --   --   --   --   --  357.9* 491.7*  --   MG  --   --   --   --   --  2.0 1.9 1.9 2.0  CALCIUM 8.9 7.7*  --   --   --  8.4* 8.3* 8.1* 8.3*    Total Time in  preparing paper work, data evaluation and todays exam - 35 minutes  Signature  -    Susa Raring M.D on 10/26/2022 at 8:21 AM   -  To page go to [email or web address]

## 2023-08-11 ENCOUNTER — Other Ambulatory Visit: Payer: Self-pay

## 2023-08-11 ENCOUNTER — Emergency Department (HOSPITAL_COMMUNITY): Payer: Self-pay

## 2023-08-11 ENCOUNTER — Encounter (HOSPITAL_COMMUNITY): Payer: Self-pay | Admitting: Emergency Medicine

## 2023-08-11 ENCOUNTER — Emergency Department (HOSPITAL_COMMUNITY)
Admission: EM | Admit: 2023-08-11 | Discharge: 2023-08-11 | Disposition: A | Discharge status: Home / Self Care | Payer: Self-pay | Attending: Emergency Medicine | Admitting: Emergency Medicine

## 2023-08-11 DIAGNOSIS — F17210 Nicotine dependence, cigarettes, uncomplicated: Secondary | ICD-10-CM | POA: Insufficient documentation

## 2023-08-11 DIAGNOSIS — J45909 Unspecified asthma, uncomplicated: Secondary | ICD-10-CM | POA: Insufficient documentation

## 2023-08-11 DIAGNOSIS — I951 Orthostatic hypotension: Secondary | ICD-10-CM | POA: Insufficient documentation

## 2023-08-11 LAB — CBC WITH DIFFERENTIAL/PLATELET
Abs Immature Granulocytes: 0.07 10*3/uL (ref 0.00–0.07)
Basophils Absolute: 0.1 10*3/uL (ref 0.0–0.1)
Basophils Relative: 1 %
Eosinophils Absolute: 0 10*3/uL (ref 0.0–0.5)
Eosinophils Relative: 0 %
HCT: 51.7 % (ref 39.0–52.0)
Hemoglobin: 16.7 g/dL (ref 13.0–17.0)
Immature Granulocytes: 1 %
Lymphocytes Relative: 22 %
Lymphs Abs: 2.4 10*3/uL (ref 0.7–4.0)
MCH: 29.1 pg (ref 26.0–34.0)
MCHC: 32.3 g/dL (ref 30.0–36.0)
MCV: 90.1 fL (ref 80.0–100.0)
Monocytes Absolute: 0.5 10*3/uL (ref 0.1–1.0)
Monocytes Relative: 5 %
Neutro Abs: 7.8 10*3/uL — ABNORMAL HIGH (ref 1.7–7.7)
Neutrophils Relative %: 71 %
Platelets: 216 10*3/uL (ref 150–400)
RBC: 5.74 MIL/uL (ref 4.22–5.81)
RDW: 15.5 % (ref 11.5–15.5)
WBC: 10.8 10*3/uL — ABNORMAL HIGH (ref 4.0–10.5)
nRBC: 0 % (ref 0.0–0.2)

## 2023-08-11 LAB — RAPID URINE DRUG SCREEN, HOSP PERFORMED
Amphetamines: POSITIVE — AB
Barbiturates: NOT DETECTED
Benzodiazepines: NOT DETECTED
Cocaine: NOT DETECTED
Opiates: NOT DETECTED
Tetrahydrocannabinol: POSITIVE — AB

## 2023-08-11 LAB — URINALYSIS, ROUTINE W REFLEX MICROSCOPIC
Bilirubin Urine: NEGATIVE
Glucose, UA: NEGATIVE mg/dL
Hgb urine dipstick: NEGATIVE
Ketones, ur: NEGATIVE mg/dL
Leukocytes,Ua: NEGATIVE
Nitrite: NEGATIVE
Protein, ur: NEGATIVE mg/dL
Specific Gravity, Urine: 1.002 — ABNORMAL LOW (ref 1.005–1.030)
pH: 6 (ref 5.0–8.0)

## 2023-08-11 LAB — LIPASE, BLOOD: Lipase: 25 U/L (ref 11–51)

## 2023-08-11 LAB — I-STAT CHEM 8, ED
BUN: 34 mg/dL — ABNORMAL HIGH (ref 6–20)
Calcium, Ion: 0.99 mmol/L — ABNORMAL LOW (ref 1.15–1.40)
Chloride: 109 mmol/L (ref 98–111)
Creatinine, Ser: 1.4 mg/dL — ABNORMAL HIGH (ref 0.61–1.24)
Glucose, Bld: 171 mg/dL — ABNORMAL HIGH (ref 70–99)
HCT: 48 % (ref 39.0–52.0)
Hemoglobin: 16.3 g/dL (ref 13.0–17.0)
Potassium: 3.6 mmol/L (ref 3.5–5.1)
Sodium: 141 mmol/L (ref 135–145)
TCO2: 24 mmol/L (ref 22–32)

## 2023-08-11 LAB — ACETAMINOPHEN LEVEL: Acetaminophen (Tylenol), Serum: <10 ug/mL — ABNORMAL LOW (ref 10–30)

## 2023-08-11 LAB — ETHANOL: Alcohol, Ethyl (B): <10 mg/dL (ref <10)

## 2023-08-11 LAB — SALICYLATE LEVEL: Salicylate Lvl: <7 mg/dL — ABNORMAL LOW (ref 7.0–30.0)

## 2023-08-11 MED ORDER — ONDANSETRON 4 MG PO TBDP
4.0000 mg | ORAL_TABLET | Freq: Once | ORAL | Status: AC
  Administered 2023-08-11: 4 mg via ORAL
  Filled 2023-08-11: qty 1

## 2023-08-11 NOTE — Discharge Instructions (Signed)
You were evaluated in the Emergency Department and after careful evaluation, we did not find any emergent condition requiring admission or further testing in the hospital.  Your exam/testing today is overall reassuring.  Symptoms likely due to orthostatic hypotension.  Recommend drinking more water during the day with the goal of having urine that looks like water and is not too yellow.  See if this helps.  If not recommend follow-up with a primary care doctor or cardiology for further testing.  Please return to the Emergency Department if you experience any worsening of your condition.   Thank you for allowing [place] to be a part of your care.

## 2023-08-11 NOTE — ED Notes (Signed)
Patient verbalizes understanding of discharge instructions. Opportunity for questioning and answers were provided. Armband removed by staff, pt discharged from ED. Pt ambulatory to ED waiting room with steady gait.

## 2023-08-11 NOTE — ED Provider Notes (Signed)
[facility]PT [facility] Emergency Department Provider Note MRN:  [id number]  Arrival date & time: 08/11/23     Chief Complaint   Chest Pain, Foot Pain, and Headache   History of Present Illness   Chad James is a 33 y.o. year-old male with a history of asthma, substance use disorder presenting to the ED with chief complaint of dizziness.  Dizziness described as a lightheadedness that occurs fairly often upon standing from a seated position.  Explains that it has been happening about once a week for the past 6 years.  He explains that he occasionally screams when this happens because it frightens him.  Grandmother heard the screaming this evening and insisted on evaluation here in the emergency department  Patient had been sober to illicit drugs for the past few months.  Still uses marijuana regularly but has been staying away from opioids and methamphetamine.  Relapsed 2 days ago, smoking meth with a friend.  Review of Systems  A thorough review of systems was obtained and all systems are negative except as noted in the HPI and PMH.   Patient's Health History    Past Medical History:  Diagnosis Date   Asthma    Depression    Heroin abuse (HCC)     No past surgical history on file.  No family history on file.  Social History   Socioeconomic History   Marital status: Single    Spouse name: Not on file   Number of children: Not on file   Years of education: Not on file   Highest education level: Not on file  Occupational History   Not on file  Tobacco Use   Smoking status: Every Day    Current packs/day: 1.00    Types: Cigarettes   Smokeless tobacco: Never  Substance and Sexual Activity   Alcohol use: Not Currently    Comment: occ   Drug use: Not Currently    Types: Marijuana, Cocaine, IV    Comment: IV heroin use today 11/14   Sexual activity: Not on file  Other Topics Concern   Not on file  Social History Narrative   Not on file   Social Drivers  of Health   Financial Resource Strain: Not on file  Food Insecurity: No Food Insecurity (10/20/2022)   Hunger Vital Sign    Worried About Running Out of Food in the Last Year: Never true    Ran Out of Food in the Last Year: Never true  Transportation Needs: No Transportation Needs (10/20/2022)   PRAPARE - Administrator, Civil Service (Medical): No    Lack of Transportation (Non-Medical): No  Physical Activity: Not on file  Stress: Not on file  Social Connections: Not on file  Intimate Partner Violence: Not At Risk (10/20/2022)   Humiliation, Afraid, Rape, and Kick questionnaire    Fear of Current or Ex-Partner: No    Emotionally Abused: No    Physically Abused: No    Sexually Abused: No     Physical Exam   Vitals:   08/11/23 2155 08/11/23 2333  BP: 111/79 118/77  Pulse: (!) 114 (!) 102  Resp: 18 17  Temp:  98 F (36.7 C)  SpO2: 96% 98%    CONSTITUTIONAL: Well-appearing, NAD NEURO/PSYCH:  Alert and oriented x 3, no focal deficits EYES:  eyes equal and reactive ENT/NECK:  no LAD, no JVD CARDIO: Regular rate, well-perfused, normal S1 and S2 PULM:  CTAB no wheezing or rhonchi GI/GU:  non-distended, non-tender MSK/SPINE:  No gross deformities, no edema SKIN:  no rash, atraumatic   *Additional and/or pertinent findings included in MDM below  Diagnostic and Interventional Summary    EKG Interpretation Date/Time:  Thursday August 11 2023 15:20:14 EST Ventricular Rate:  84 PR Interval:  134 QRS Duration:  84 QT Interval:  392 QTC Calculation: 463 R Axis:   78  Text Interpretation: Normal sinus rhythm Normal ECG When compared with ECG of 20-Oct-2022 09:14, PREVIOUS ECG IS PRESENT Confirmed by Virgina Norfolk [phone]) on 08/11/2023 9:48:41 PM       Labs Reviewed  CBC WITH DIFFERENTIAL/PLATELET - Abnormal; Notable for the following components:      Result Value   WBC 10.8 (*)    Neutro Abs 7.8 (*)    All other components within normal limits   ACETAMINOPHEN LEVEL - Abnormal; Notable for the following components:   Acetaminophen (Tylenol), Serum <10 (*)    All other components within normal limits  SALICYLATE LEVEL - Abnormal; Notable for the following components:   Salicylate Lvl <7.0 (*)    All other components within normal limits  RAPID URINE DRUG SCREEN, HOSP PERFORMED - Abnormal; Notable for the following components:   Amphetamines POSITIVE (*)    Tetrahydrocannabinol POSITIVE (*)    All other components within normal limits  URINALYSIS, ROUTINE W REFLEX MICROSCOPIC - Abnormal; Notable for the following components:   Color, Urine STRAW (*)    Specific Gravity, Urine 1.002 (*)    All other components within normal limits  I-STAT CHEM 8, ED - Abnormal; Notable for the following components:   BUN 34 (*)    Creatinine, Ser 1.40 (*)    Glucose, Bld 171 (*)    Calcium, Ion 0.99 (*)    All other components within normal limits  ETHANOL  LIPASE, BLOOD  TROPONIN I (HIGH SENSITIVITY)    DG Chest 2 View  Final Result      Medications  ondansetron (ZOFRAN-ODT) disintegrating tablet 4 mg (4 mg Oral Given 08/11/23 1558)     Procedures  /  Critical Care Procedures  ED Course and Medical Decision Making  Initial Impression and Ddx Patient may be experiencing persistent orthostatic hypotension.  He says he never drinks water.  Vital signs are reassuring.  When he stands up and ambulates with me in the room he is not having any issues, does not exhibit any significant tachycardia or hypotension when standing.  Currently not having any chest pain or shortness of breath, no abdominal pain, no numbness weakness to the arms or legs, no headache.  Given this and the chronicity of the symptoms, overall doubt emergent process currently.  Important to consider the chart review and patient's history of drug use related admissions though no fever, no murmurs on exam.  Past medical/surgical history that increases complexity of ED  encounter: Substance use disorder  Interpretation of Diagnostics I personally reviewed the EKG and my interpretation is as follows: Sinus rhythm without concerning features  Labs reassuring without significant blood count or electrolyte disturbance.  UDS positive for THC and amphetamines  Patient Reassessment and Ultimate Disposition/Management     Given patient's presentation and reassuring workup he is appropriate for discharge.  Patient management required discussion with the following services or consulting groups:  None  Complexity of Problems Addressed Acute illness or injury that poses threat of life of bodily function  Additional Data Reviewed and Analyzed Further history obtained from: Further history from spouse/family member  Additional Factors  Impacting ED Encounter Risk None  Elmer Sow. Pilar Plate, MD [facility] Emergency Medicine [facility] [email or web address]  Final Clinical Impressions(s) / ED Diagnoses     ICD-10-CM   1. Orthostatic hypotension  I95.1 Ambulatory referral to Cardiology      ED Discharge Orders          Ordered    Ambulatory referral to Cardiology        08/11/23 2328             Discharge Instructions Discussed with and Provided to Patient:    Discharge Instructions      You were evaluated in the Emergency Department and after careful evaluation, we did not find any emergent condition requiring admission or further testing in the hospital.  Your exam/testing today is overall reassuring.  Symptoms likely due to orthostatic hypotension.  Recommend drinking more water during the day with the goal of having urine that looks like water and is not too yellow.  See if this helps.  If not recommend follow-up with a primary care doctor or cardiology for further testing.  Please return to the Emergency Department if you experience any worsening of your condition.   Thank you for allowing [place] to be a part of your care.       Sabas Sous, MD 08/11/23 2337

## 2023-08-11 NOTE — ED Triage Notes (Signed)
Pt. Stated, I've been sober for 2 weeks , 2 months and proceeded to tell me a story about a man dying. He stated, Sometimes when I stand up I get  dizzy and hot flashes for 2 months and 2 weeks

## 2023-08-11 NOTE — ED Provider Triage Note (Signed)
Emergency Medicine Provider Triage Evaluation Note  Chad James , a 33 y.o. male  was evaluated in triage.  Patient with pressured speech, rambling.  Cannot give me good history of as to why he is here.  However, mom at bedside reports that he does use heroin, but reports concern that he was using other substances as well.  She also reports the patient stated he had chest pain shortness of breath earlier.  Review of Systems  Positive: As above Negative: As above  Physical Exam  BP 129/88   Pulse 92   Resp 18   Ht 6\' 2"  (1.88 m)   Wt 70.8 kg   SpO2 98%   BMI 20.03 kg/m  Gen:   Awake, restless and fidgeting.  Rambling speech Resp:  Normal effort  MSK:   Moves extremities without difficulty    Medical Decision Making  Medically screening exam initiated at 3:36 PM.  Appropriate orders placed.  Tiney Rouge was informed that the remainder of the evaluation will be completed by another provider, this initial triage assessment does not replace that evaluation, and the importance of remaining in the ED until their evaluation is complete.     Arabella Merles, PA-C 08/11/23 1538

## 2023-12-13 ENCOUNTER — Encounter: Payer: Self-pay | Admitting: Cardiology

## 2023-12-13 ENCOUNTER — Ambulatory Visit: Payer: Self-pay | Admitting: Cardiology

## 2023-12-15 ENCOUNTER — Ambulatory Visit: Payer: Self-pay

## 2023-12-27 ENCOUNTER — Ambulatory Visit: Payer: Self-pay
